# Patient Record
Sex: Female | Born: 1975 | Race: White | Hispanic: No | Marital: Married | Smoking: Former smoker
Health system: Southern US, Community
[De-identification: ages and names within clinical notes are randomized; demographics above are authoritative.]

## PROBLEM LIST (undated history)

## (undated) DIAGNOSIS — L409 Psoriasis, unspecified: Secondary | ICD-10-CM

## (undated) DIAGNOSIS — F419 Anxiety disorder, unspecified: Secondary | ICD-10-CM

## (undated) DIAGNOSIS — D649 Anemia, unspecified: Secondary | ICD-10-CM

## (undated) DIAGNOSIS — R51 Headache: Secondary | ICD-10-CM

## (undated) DIAGNOSIS — G43009 Migraine without aura, not intractable, without status migrainosus: Secondary | ICD-10-CM

## (undated) DIAGNOSIS — K219 Gastro-esophageal reflux disease without esophagitis: Secondary | ICD-10-CM

## (undated) DIAGNOSIS — K912 Postsurgical malabsorption, not elsewhere classified: Secondary | ICD-10-CM

## (undated) DIAGNOSIS — J302 Other seasonal allergic rhinitis: Secondary | ICD-10-CM

## (undated) DIAGNOSIS — K90829 Short bowel syndrome, unspecified: Secondary | ICD-10-CM

## (undated) HISTORY — DX: Headache: R51

## (undated) HISTORY — DX: Migraine without aura, not intractable, without status migrainosus: G43.009

## (undated) HISTORY — PX: PLANTAR FASCIA SURGERY: SHX746

## (undated) HISTORY — DX: Anemia, unspecified: D64.9

## (undated) HISTORY — PX: WISDOM TOOTH EXTRACTION: SHX21

## (undated) HISTORY — DX: Short bowel syndrome, unspecified: K90.829

## (undated) HISTORY — DX: Other seasonal allergic rhinitis: J30.2

## (undated) HISTORY — DX: Psoriasis, unspecified: L40.9

## (undated) HISTORY — DX: Anxiety disorder, unspecified: F41.9

## (undated) HISTORY — DX: Gastro-esophageal reflux disease without esophagitis: K21.9

## (undated) HISTORY — DX: Postsurgical malabsorption, not elsewhere classified: K91.2

---

## 1998-10-06 ENCOUNTER — Other Ambulatory Visit: Admission: RE | Admit: 1998-10-06 | Discharge: 1998-10-06 | Payer: Self-pay | Admitting: Gynecology

## 1999-12-19 ENCOUNTER — Ambulatory Visit (HOSPITAL_COMMUNITY): Admission: EM | Admit: 1999-12-19 | Discharge: 1999-12-19 | Payer: Self-pay

## 2000-01-17 ENCOUNTER — Ambulatory Visit (HOSPITAL_BASED_OUTPATIENT_CLINIC_OR_DEPARTMENT_OTHER): Admission: RE | Admit: 2000-01-17 | Discharge: 2000-01-17 | Payer: Self-pay | Admitting: Orthopedic Surgery

## 2000-10-23 ENCOUNTER — Other Ambulatory Visit: Admission: RE | Admit: 2000-10-23 | Discharge: 2000-10-23 | Payer: Self-pay | Admitting: *Deleted

## 2000-12-31 HISTORY — PX: CARPAL TUNNEL RELEASE: SHX101

## 2001-10-29 ENCOUNTER — Other Ambulatory Visit: Admission: RE | Admit: 2001-10-29 | Discharge: 2001-10-29 | Payer: Self-pay | Admitting: *Deleted

## 2005-03-12 ENCOUNTER — Ambulatory Visit: Payer: Self-pay | Admitting: Internal Medicine

## 2005-07-31 ENCOUNTER — Ambulatory Visit: Payer: Self-pay | Admitting: Internal Medicine

## 2006-01-17 ENCOUNTER — Ambulatory Visit (HOSPITAL_COMMUNITY): Admission: RE | Admit: 2006-01-17 | Discharge: 2006-01-17 | Payer: Self-pay | Admitting: Family Medicine

## 2006-01-17 ENCOUNTER — Encounter: Admission: RE | Admit: 2006-01-17 | Discharge: 2006-01-17 | Payer: Self-pay | Admitting: Family Medicine

## 2006-01-17 ENCOUNTER — Ambulatory Visit: Payer: Self-pay | Admitting: Family Medicine

## 2006-01-18 ENCOUNTER — Ambulatory Visit: Payer: Self-pay | Admitting: Family Medicine

## 2006-01-23 ENCOUNTER — Ambulatory Visit: Payer: Self-pay

## 2006-01-29 ENCOUNTER — Encounter: Payer: Self-pay | Admitting: Cardiology

## 2006-01-29 ENCOUNTER — Ambulatory Visit: Payer: Self-pay

## 2006-02-06 ENCOUNTER — Ambulatory Visit: Payer: Self-pay | Admitting: Critical Care Medicine

## 2006-03-18 ENCOUNTER — Ambulatory Visit: Payer: Self-pay | Admitting: Cardiology

## 2006-04-02 ENCOUNTER — Ambulatory Visit: Payer: Self-pay

## 2006-04-25 ENCOUNTER — Ambulatory Visit: Payer: Self-pay | Admitting: Family Medicine

## 2007-01-31 ENCOUNTER — Emergency Department (HOSPITAL_COMMUNITY): Admission: EM | Admit: 2007-01-31 | Discharge: 2007-01-31 | Payer: Self-pay | Admitting: Emergency Medicine

## 2007-06-19 ENCOUNTER — Encounter (INDEPENDENT_AMBULATORY_CARE_PROVIDER_SITE_OTHER): Payer: Self-pay | Admitting: Obstetrics and Gynecology

## 2007-06-19 ENCOUNTER — Inpatient Hospital Stay (HOSPITAL_COMMUNITY): Admission: AD | Admit: 2007-06-19 | Discharge: 2007-06-22 | Payer: Self-pay | Admitting: Obstetrics and Gynecology

## 2007-11-06 ENCOUNTER — Ambulatory Visit: Payer: Self-pay | Admitting: Family Medicine

## 2007-11-06 DIAGNOSIS — R51 Headache: Secondary | ICD-10-CM

## 2007-11-06 DIAGNOSIS — R519 Headache, unspecified: Secondary | ICD-10-CM | POA: Insufficient documentation

## 2011-05-15 NOTE — Op Note (Signed)
Jody Parrish, Jody Parrish               ACCOUNT NO.:  [id number]   MEDICAL RECORD NO.:  [id number]          PATIENT TYPE:  INP   LOCATION:  9102                          FACILITY:  [facility]   PHYSICIAN:  Maxie Better, M.D.DATE OF BIRTH:  [birth year]   DATE OF PROCEDURE:  06/19/2007  DATE OF DISCHARGE:                               OPERATIVE REPORT   PREOPERATIVE DIAGNOSIS:  Fetal tachycardia, arrest of dilatation.   PROCEDURE:  Primary cesarean section Kerr hysterotomy.   POSTOPERATIVE DIAGNOSIS:  Fetal tachycardia arrest of dilatation.   ANESTHESIA:  Epidural.   SURGEON:  Maxie Better, M.D.   ASSISTANT:  Gerri Spore B. Earlene Plater, M.D.   PROCEDURE:  Under adequate epidural anesthesia the patient is placed in  the supine position with a left lateral tilt.  An indwelling Foley  catheter was already in place.  The patient was sterilely prepped and  draped in usual fashion.  Quarter percent Marcaine was injected along  the planned Pfannenstiel skin incision site.  The Pfannenstiel skin  incision was then made, carried down to the rectus fascia.  Rectus  fascia was opened transversely.  Rectus fascia was bluntly and sharply  dissected off the rectus muscle in superior and inferior fashion.  Rectus muscles split in midline.  The parietal peritoneum was entered  bluntly and extended superiorly and inferiorly.  The vesicouterine  peritoneum was opened transversely.  The bladder was then bluntly  dissected off the lower uterine segment and displaced inferiorly.  A  curvilinear low transverse uterine incision was then made and extended  with bandage scissors.  Subsequent delivery of a live female from the  right occiput transverse position was accomplished.  Cord around the  neck x1 was reducible.  The baby was bulb suctioned on the abdomen. Cord  was clamped, cut the baby was transferred to the awaiting pediatrician  who assigned Apgars of 8 and 10 at 1 an 5 minutes.  The placenta which  was  posterior was spontaneous intact.  Uterine cavity was cleaned of  debris.  Uterine incision had no extension.  Uterine incision was closed  in two layers the first layer 0 Monocryl running locked stitch.  Second  layer was imbricating using 0 Monocryl suture.  Normal tubes and ovaries  were noted bilaterally.  The abdomen was copiously irrigated, suctioned  of debris.  The parietal peritoneum was closed with 2-0 Vicryl suture.  The rectus fascia was closed with 0 Vicryl x2.  The subcutaneous areas  irrigated, small bleeders cauterized 2-0 plain interrupted sutures were  then placed. Skin was approximated using Ethicon staples.  Specimen was  placenta sent to pathology.  Estimated blood loss was 600 mL.  Intraoperative fluid was 1400 mL crystalloid.  Urine output was 150 mL  slightly bloody urine  which was clearing at the time of transfer to the recovery room.  The  blood was in the urine prior to starting the surgery.  Sponge and  instrument counts x2 was correct.  Complication was none.  The baby was  8 pounds 2 ounces.  The patient tolerated the procedure well, was  transferred  to recovery in stable condition.      Maxie Better, M.D.  Electronically Signed     [place]/MEDQ  D:  06/19/2007  T:  06/20/2007  Job:  [id number]

## 2011-05-18 NOTE — Op Note (Signed)
[place]  Patient:    Jody Parrish                       MRN: [id number] Proc. Date: 01/17/00 Adm. Date:  [id number] Attending:  Marlowe [place] CC:         Artist Pais Weingold, M.D. x 2                           Operative Report  PREOPERATIVE DIAGNOSIS:  Right wrist median ulnar nerve compression.  POSTOPERATIVE DIAGNOSIS:  Right wrist median ulnar nerve compression.  OPERATION:  Decompression of median nerve and ulnar nerve through separate fascial incisions at the right wrist.  SURGEON:  Artist Pais. Mina Marble, M.D.  ANESTHESIA:  General.  TOURNIQUET TIME:  31 minutes.  COMPLICATIONS:  None.  DRAINS:  None.  DESCRIPTION OF PROCEDURE:  The patient was taken to the operating room after induction of adequate general anesthesia.  The right upper extremity is prepped and draped in the usual sterile fashion.  An Esmarch was used to exsanguinate the limb and the tourniquet was inflated with 250 mmHg.  At this point and time, an incision was made starting at the distal aspect in the area of the thenar crease extending across to the distal wrist crease and then curving over toward the ulnar side with care to cross the flexion creases of the wrist at 60 degree angles and then carried proximally along the FCU tendon for approximately 4 cm.  The incision was taken down through the skin and subcutaneous tissues with care to carefully identify and retract branches of the median ulnar nerve subcutaneously. At the most proximal part of the wound, the ulnar artery and nerve was identified and decompressed to the level of Guillands canal.  At this point and time, a large flap was raised over Guillands canal and the carpal canal and the ulnar nerve was decompressed to its bifurcation into the deep motor branch and the arch.  Decompression was carried out using tenotomy scissors under loupe magnification.  After the ulnar artery nerve  had been adequately decompressed distally, attention was then paid to the radial part of the wound where the median nerve was identified just under the palmaris longus tendon.  The median nerve was identified and decompressed proximally for a distance of 5 cm under the skin and then distally the proximal edge of the transverse carpal ligament was identified.  Using a freer elevator under the transverse carpal ligament to protect the median nerve, the transverse carpal ligament was then divided in its entirety using Stout scissors.  Inspection of the median nerve showed significant amount of compression with hyperemia and throughout its entire length into the carpal canal.  The median nerve was explored down to the superficial palmar arch and decompressed to that level.  Attention was again redirected toward the ulnar artery nerve where continued decompression was carried out distally until the entire neurovascular bundle had been completely decompressed.  The wounds were then thoroughly irrigated and the skin was then closed with 4-0 nylon in a combination of simple and horizontal mattress sutures.  A sterile dressing of Xeroform, 4 by 4s, fluff, and a compressive dressing as well as a volar splint was applied.  The patient tolerated the procedure well and went to the recovery room in stable fashion. DD:  01/17/00 TD:  01/17/00 Job: [id number]

## 2011-05-18 NOTE — Discharge Summary (Signed)
Jody Parrish, Jody Parrish               ACCOUNT NO.:  [id number]   MEDICAL RECORD NO.:  [id number]          PATIENT TYPE:  INP   LOCATION:  9102                          FACILITY:  [facility]   PHYSICIAN:  Maxie Better, M.D.DATE OF BIRTH:  [birth year]   DATE OF ADMISSION:  06/19/2007  DATE OF DISCHARGE:  06/22/2007                               DISCHARGE SUMMARY   ADMISSION DIAGNOSIS:  Latent phase labor,  term gestation.   DISCHARGE DIAGNOSES:  1. Term gestation, delivered.  2. Status post primary cesarean section.  3. Arrest of dilatation.  4. Fetal tachycardia.   PROCEDURE:  Primary cesarean section.   HISTORY OF PRESENT ILLNESS:  This is a 35 year old G3, P0-0-2-0 female  at 41-1/7th's weeks presented in early labor.   HOSPITAL COURSE:  The patient was admitted to [facility].  Her  cervical exam was 2-to3-cm, 80% vertex, minus 2 with a bulging bag of  membranes.  Her tracing was reassuring.  Her contractions were every 2-4  minutes, moderate by palpation.  The patient desired natural childbirth  and expectant management was done.  The patient had artifical rupture of  membranes with copious clear fluid noted at the time that she was 6-cm,  80%, minus 3.  The patient remained in labor.  She progressed to 9-cm,  however, intrauterine pressure catheter which had been placed showed  that she had a greater than 200 MV units for greater than 2 hours with  no cervical change on examination.  The tracing was notable for fetal  tachycardia to the 180s.  Gentamicin and clindamycin were started.  A  urine culture was sent.  The exam was notable for the maternal temp of  101.  She was 9-cm but occiput transverse, minus station.  The  presumption was that the fetal tachycardia was secondary to maternal  temp which was due to  presumed chorioamnionitis.  Given the fact that  the patient had protracted active phase, followed by no further  progression with adequate labor, the primary  cesarean section was  recommended.  The patient a primary cesarean section.  The procedure  resulted in the delivery of a live female with a cord around the neck, 8  pounds, 2 ounces, Apgar's of 8 and 10.  The placenta was sent to  pathology and showed no evidence of chorioamnionitis.  The patient  subsequently had an uncomplicated postoperative course.  She was  continued on antibiotics until she was afebrile for greater than 24  hours.  On post-op day #3, the patient remained afebrile.  She  subsequently had no evidence of infection from her incision and was  tolerating a regular diet, was deemed well to be discharged home.   DISPOSITION:  Home.   CONDITION:  Stable.   DISCHARGE MEDICATIONS:  1. Ibuprofen 800 mg every 8 hours p.r.n. pain.  2. Percocet 1-2 tablets every 4-6 hours p.r.n. pain.  3. Prenatal vitamins 1 p.o. daily.   DISCHARGE INSTRUCTIONS:  Per the postpartum booklet given.   FOLLOWUP APPOINTMENT:  At [facility] in 6 weeks.      Maxie Better, M.D.  Electronically Signed     [place]/MEDQ  D:  07/30/2007  T:  07/31/2007  Job:  [id number]

## 2011-07-31 ENCOUNTER — Ambulatory Visit (INDEPENDENT_AMBULATORY_CARE_PROVIDER_SITE_OTHER): Payer: BC Managed Care – PPO | Admitting: Family

## 2011-07-31 ENCOUNTER — Encounter: Payer: Self-pay | Admitting: Family

## 2011-07-31 VITALS — BP 102/80 | HR 78 | Temp 98.0°F | Resp 16 | Ht 64.5 in | Wt 199.1 lb

## 2011-07-31 DIAGNOSIS — H6690 Otitis media, unspecified, unspecified ear: Secondary | ICD-10-CM

## 2011-07-31 DIAGNOSIS — H6691 Otitis media, unspecified, right ear: Secondary | ICD-10-CM

## 2011-07-31 MED ORDER — AZITHROMYCIN 250 MG PO TABS: ORAL_TABLET | ORAL | Status: AC

## 2011-07-31 MED ORDER — CEFUROXIME AXETIL 500 MG PO TABS: 500.0000 mg | ORAL_TABLET | Freq: Two times a day (BID) | ORAL | Status: DC

## 2011-07-31 MED ORDER — NEOMYCIN-POLYMYXIN-HC 3.5-10000-1 OT SOLN: 3.0000 [drp] | Freq: Three times a day (TID) | OTIC | Status: AC

## 2011-07-31 NOTE — Assessment & Plan Note (Signed)
rx cancelled for cefuroxime- (Pt reports hives with amoxicillin). Will treat for probable right right OM.  If no improvement in pain in 48 hrs pt to also start cortisporin drops.

## 2011-07-31 NOTE — Progress Notes (Signed)
  Subjective:    Patient ID: Jody Parrish, female    DOB: 08/12/1976, 35 y.o.   MRN: [id number]  HPI  Jody Parrish is a 35 yr old female who presents today with chief complaint of bilateral ear pain R>L.  Symptoms started 1 week ago.  Symptoms have been exacerbated by swimming. Some improvement in her pain with the use of motrin.Denies problems with hearing or ear drainage.  Denies fever or nasal congestion.     Review of Systems See HPI  No past medical history on file.  History   Social History  . Marital Status: Single    Spouse Name: N/A    Number of Children: N/A  . Years of Education: N/A   Occupational History  . Not on file.   Social History Main Topics  . Smoking status: Former Smoker -- 10 years    Types: Cigarettes  . Smokeless tobacco: Never Used  . Alcohol Use: Not on file  . Drug Use: Not on file  . Sexually Active: Not on file   Other Topics Concern  . Not on file   Social History Narrative  . No narrative on file    No past surgical history on file.  No family history on file.  Allergies  Allergen Reactions  . Amoxicillin Hives    No current outpatient prescriptions on file prior to visit.    BP 102/80  Pulse 78  Temp(Src) 98 F (36.7 C) (Oral)  Resp 16  Ht 5' 4.5" (1.638 m)  Wt 199 lb 1.3 oz (90.302 kg)  BMI 33.64 kg/m2       Objective:   Physical Exam  Constitutional: She appears well-developed and well-nourished.  HENT:  Mouth/Throat: Uvula is midline, oropharynx is clear and moist and mucous membranes are normal.       Right canal is slightly pink, R TM is dull without bulging.  L canal- less pink than right canal R TM intact- + COL.    Eyes: Conjunctivae are normal.  Psychiatric: She has a normal mood and affect. Her behavior is normal. Judgment and thought content normal.          Assessment & Plan:

## 2011-07-31 NOTE — Patient Instructions (Signed)
Please follow up as needed. Start ear drops in addition to your oral antibiotics if you ears are not feeling better in 48 hours.

## 2011-10-17 LAB — CBC
HCT: 32.3 — ABNORMAL LOW
Hemoglobin: 11.1 — ABNORMAL LOW
MCHC: 34.1
Platelets: 181
Platelets: 201
RDW: 13.9

## 2011-10-17 LAB — CREATININE, SERUM
Creatinine, Ser: 0.84
GFR calc Af Amer: >60
GFR calc non Af Amer: >60

## 2011-10-17 LAB — URINE CULTURE
Culture: NO GROWTH
Special Requests: NEGATIVE

## 2012-12-20 LAB — HM PAP SMEAR

## 2013-05-22 ENCOUNTER — Encounter: Payer: Self-pay | Admitting: Family Medicine

## 2013-05-22 ENCOUNTER — Ambulatory Visit (INDEPENDENT_AMBULATORY_CARE_PROVIDER_SITE_OTHER): Payer: BC Managed Care – PPO | Admitting: Family Medicine

## 2013-05-22 VITALS — BP 100/68 | HR 96 | Temp 99.3°F | Wt 191.0 lb

## 2013-05-22 DIAGNOSIS — H60399 Other infective otitis externa, unspecified ear: Secondary | ICD-10-CM

## 2013-05-22 DIAGNOSIS — H60393 Other infective otitis externa, bilateral: Secondary | ICD-10-CM

## 2013-05-22 MED ORDER — OFLOXACIN 0.3 % OT SOLN: 10.0000 [drp] | Freq: Every day | OTIC | Status: DC

## 2013-05-22 NOTE — Progress Notes (Signed)
  Subjective:     Jody Parrish is a 37 y.o. female who presents for evaluation of symptoms of a URI, R ear pain. Symptoms include right ear pressure/pain. Onset of symptoms was a few days ago, and has been gradually worsening since that time. Treatment to date: antihistamines and nasal steroids.  The following portions of the patient's history were reviewed and updated as appropriate: allergies, current medications, past family history, past medical history, past social history, past surgical history and problem list.  Review of Systems Pertinent items are noted in HPI.   Objective:    BP 100/68  Pulse 96  Temp(Src) 99.3 F (37.4 C) (Oral)  Wt 191 lb (86.637 kg)  BMI 32.29 kg/m2  SpO2 95% General appearance: alert, cooperative, appears stated age and no distress Ears: both canals errythematous---R>L Nose: clear discharge, mild congestion, turbinates red, swollen, no sinus tenderness Throat: abnormal findings: mild oropharyngeal erythema and PND Neck: no adenopathy, supple, symmetrical, trachea midline and thyroid not enlarged, symmetric, no tenderness/mass/nodules Lungs: clear to auscultation bilaterally Heart: S1, S2 normal   Assessment:    viral upper respiratory illness and otits externa   Plan:    Discussed diagnosis and treatment of URI. Suggested symptomatic OTC remedies. Nasal steroids per orders. Follow up as needed. cont claritin  floxin otic drops Call prn

## 2013-05-22 NOTE — Patient Instructions (Signed)
Otitis Externa Otitis externa is a bacterial or fungal infection of the outer ear canal. This is the area from the eardrum to the outside of the ear. Otitis externa is sometimes called "swimmer's ear." CAUSES  Possible causes of infection include:  Swimming in dirty water.  Moisture remaining in the ear after swimming or bathing.  Mild injury (trauma) to the ear.  Objects stuck in the ear (foreign body).  Cuts or scrapes (abrasions) on the outside of the ear. SYMPTOMS  The first symptom of infection is often itching in the ear canal. Later signs and symptoms may include swelling and redness of the ear canal, ear pain, and yellowish-white fluid (pus) coming from the ear. The ear pain may be worse when pulling on the earlobe. DIAGNOSIS  Your caregiver will perform a physical exam. A sample of fluid may be taken from the ear and examined for bacteria or fungi. TREATMENT  Antibiotic ear drops are often given for 10 to 14 days. Treatment may also include pain medicine or corticosteroids to reduce itching and swelling. PREVENTION   Keep your ear dry. Use the corner of a towel to absorb water out of the ear canal after swimming or bathing.  Avoid scratching or putting objects inside your ear. This can damage the ear canal or remove the protective wax that lines the canal. This makes it easier for bacteria and fungi to grow.  Avoid swimming in lakes, polluted water, or poorly chlorinated pools.  You may use ear drops made of rubbing alcohol and vinegar after swimming. Combine equal parts of white vinegar and alcohol in a bottle. Put 3 or 4 drops into each ear after swimming. HOME CARE INSTRUCTIONS   Apply antibiotic ear drops to the ear canal as prescribed by your caregiver.  Only take over-the-counter or prescription medicines for pain, discomfort, or fever as directed by your caregiver.  If you have diabetes, follow any additional treatment instructions from your caregiver.  Keep all  follow-up appointments as directed by your caregiver. SEEK MEDICAL CARE IF:   You have a fever.  Your ear is still red, swollen, painful, or draining pus after 3 days.  Your redness, swelling, or pain gets worse.  You have a severe headache.  You have redness, swelling, pain, or tenderness in the area behind your ear. MAKE SURE YOU:   Understand these instructions.  Will watch your condition.  Will get help right away if you are not doing well or get worse. Document Released: 12/17/2005 Document Revised: 03/10/2012 Document Reviewed: 01/03/2012 [employer] Patient Information 2014 [employer].  

## 2013-09-03 ENCOUNTER — Ambulatory Visit: Payer: Self-pay | Admitting: Certified Nurse Midwife

## 2013-11-05 ENCOUNTER — Encounter: Payer: Self-pay | Admitting: Family Medicine

## 2013-11-05 ENCOUNTER — Ambulatory Visit (INDEPENDENT_AMBULATORY_CARE_PROVIDER_SITE_OTHER): Payer: BC Managed Care – PPO | Admitting: Family Medicine

## 2013-11-05 ENCOUNTER — Other Ambulatory Visit: Payer: Self-pay

## 2013-11-05 VITALS — BP 120/78 | HR 83 | Temp 98.7°F | Wt 190.6 lb

## 2013-11-05 DIAGNOSIS — H10029 Other mucopurulent conjunctivitis, unspecified eye: Secondary | ICD-10-CM

## 2013-11-05 DIAGNOSIS — J019 Acute sinusitis, unspecified: Secondary | ICD-10-CM

## 2013-11-05 DIAGNOSIS — H10023 Other mucopurulent conjunctivitis, bilateral: Secondary | ICD-10-CM

## 2013-11-05 MED ORDER — CLARITHROMYCIN ER 500 MG PO TB24: 1000.0000 mg | ORAL_TABLET | Freq: Every day | ORAL | Status: AC

## 2013-11-05 MED ORDER — MOXIFLOXACIN HCL 0.5 % OP SOLN: 1.0000 [drp] | Freq: Three times a day (TID) | OPHTHALMIC | Status: DC

## 2013-11-05 NOTE — Progress Notes (Signed)
  Subjective:     Jody Parrish is a 37 y.o. female who presents for evaluation of sinus pain. Symptoms include: congestion, facial pain, headaches, itchy eyes, nasal congestion, purulent rhinorrhea, sinus pressure and pink eye-- everyone in house has it. Onset of symptoms was 4 days ago. Symptoms have been gradually worsening since that time. Past history is significant for no history of pneumonia or bronchitis. Patient is a non-smoker.  The following portions of the patient's history were reviewed and updated as appropriate: allergies, current medications, past family history, past medical history, past social history, past surgical history and problem list.  Review of Systems Pertinent items are noted in HPI.   Objective:    BP 120/78  Pulse 83  Temp(Src) 98.7 F (37.1 C) (Oral)  Wt 190 lb 9.6 oz (86.456 kg)  SpO2 97% General appearance: alert, cooperative, appears stated age and no distress Eyes: + conjunctiva injected, teary Ears: normal TM's and external ear canals both ears Nose: green discharge, moderate congestion, turbinates red, swollen, sinus tenderness bilateral Throat: lips, mucosa, and tongue normal; teeth and gums normal Neck: mild anterior cervical adenopathy, supple, symmetrical, trachea midline and thyroid not enlarged, symmetric, no tenderness/mass/nodules Lungs: clear to auscultation bilaterally Heart: S1, S2 normal    Assessment:    Acute bacterial sinusitis and b/l pink eye.    Plan:    Nasal steroids per medication orders. Antihistamines per medication orders. Biaxin per medication orders. vigamox

## 2013-11-05 NOTE — Patient Instructions (Signed)

## 2014-04-20 ENCOUNTER — Ambulatory Visit (INDEPENDENT_AMBULATORY_CARE_PROVIDER_SITE_OTHER): Payer: BC Managed Care – PPO | Admitting: Family Medicine

## 2014-04-20 ENCOUNTER — Encounter: Payer: Self-pay | Admitting: Family Medicine

## 2014-04-20 VITALS — BP 112/80 | HR 83 | Temp 98.6°F | Wt 192.8 lb

## 2014-04-20 DIAGNOSIS — J329 Chronic sinusitis, unspecified: Secondary | ICD-10-CM

## 2014-04-20 MED ORDER — METHYLPREDNISOLONE ACETATE 80 MG/ML IJ SUSP
80.0000 mg | Freq: Once | INTRAMUSCULAR | Status: DC
  Administered 2014-04-20: 80 mg via INTRAMUSCULAR

## 2014-04-20 MED ORDER — SULFAMETHOXAZOLE-TMP DS 800-160 MG PO TABS: 1.0000 | ORAL_TABLET | Freq: Two times a day (BID) | ORAL | Status: DC

## 2014-04-20 NOTE — Addendum Note (Signed)
Addended by: Rudene Anda on: 04/20/2014 01:58 PM   Modules accepted: Orders

## 2014-04-20 NOTE — Patient Instructions (Signed)

## 2014-04-20 NOTE — Progress Notes (Signed)
Pre visit review using our clinic review tool, if applicable. No additional management support is needed unless otherwise documented below in the visit note. 

## 2014-04-20 NOTE — Progress Notes (Signed)
  Subjective:     Jody Parrish is a 38 y.o. female who presents for evaluation of sinus pain. Symptoms include: congestion, facial pain, headaches and sinus pressure. Onset of symptoms was 3 weeks ago. Symptoms have been gradually worsening since that time. Past history is significant for no history of pneumonia or bronchitis. Patient is a non-smoker.  The following portions of the patient's history were reviewed and updated as appropriate: allergies, current medications, past family history, past medical history, past social history, past surgical history and problem list.  Review of Systems Pertinent items are noted in HPI.   Objective:    BP 112/80  Pulse 83  Temp(Src) 98.6 F (37 C) (Oral)  Wt 192 lb 12.8 oz (87.454 kg)  SpO2 99% General appearance: alert, cooperative, appears stated age and mild distress Ears: normal TM's and external ear canals both ears Nose: green discharge, moderate congestion, turbinates red, swollen, sinus tenderness bilateral Throat: lips, mucosa, and tongue normal; teeth and gums normal Neck: no adenopathy, supple, symmetrical, trachea midline and thyroid not enlarged, symmetric, no tenderness/mass/nodules Lungs: clear to auscultation bilaterally Heart: S1, S2 normal    Assessment:    Acute bacterial sinusitis.    Plan:    Nasal steroids per medication orders. Antihistamines per medication orders. Bactrim per medication orders.

## 2014-04-22 ENCOUNTER — Encounter: Payer: Self-pay | Admitting: Family Medicine

## 2014-04-22 MED ORDER — CLARITHROMYCIN ER 500 MG PO TB24: 1000.0000 mg | ORAL_TABLET | Freq: Every day | ORAL | Status: DC

## 2014-04-27 ENCOUNTER — Encounter: Payer: Self-pay | Admitting: Family Medicine

## 2014-04-28 ENCOUNTER — Other Ambulatory Visit: Payer: Self-pay | Admitting: Family Medicine

## 2014-04-28 DIAGNOSIS — J329 Chronic sinusitis, unspecified: Secondary | ICD-10-CM

## 2014-04-29 ENCOUNTER — Ambulatory Visit
Admission: RE | Admit: 2014-04-29 | Discharge: 2014-04-29 | Disposition: A | Discharge status: Home / Self Care | Payer: BC Managed Care – PPO | Source: Ambulatory Visit | Attending: Family Medicine | Admitting: Family Medicine

## 2014-04-29 DIAGNOSIS — J329 Chronic sinusitis, unspecified: Secondary | ICD-10-CM

## 2014-05-03 ENCOUNTER — Other Ambulatory Visit: Payer: Self-pay | Admitting: Family Medicine

## 2014-05-03 DIAGNOSIS — G8929 Other chronic pain: Secondary | ICD-10-CM

## 2014-05-03 DIAGNOSIS — R51 Headache: Principal | ICD-10-CM

## 2014-05-06 ENCOUNTER — Encounter: Payer: Self-pay | Admitting: Family Medicine

## 2014-05-10 ENCOUNTER — Encounter: Payer: Self-pay | Admitting: Neurology

## 2014-05-10 ENCOUNTER — Ambulatory Visit (INDEPENDENT_AMBULATORY_CARE_PROVIDER_SITE_OTHER): Payer: BC Managed Care – PPO | Admitting: Neurology

## 2014-05-10 VITALS — BP 120/72 | HR 68 | Resp 18 | Ht 64.5 in | Wt 189.0 lb

## 2014-05-10 DIAGNOSIS — R51 Headache: Secondary | ICD-10-CM

## 2014-05-10 MED ORDER — PREDNISONE 10 MG PO TABS: ORAL_TABLET | ORAL | Status: DC

## 2014-05-10 NOTE — Patient Instructions (Addendum)
1. MRI brain without contrast 2. Start Prednisone 63m: Take 6 tabs on day 1, 5 tabs on day 2, 4 tabs on day 3, 3 tabs on day 4, 2 tabs on days 5 and 6, 1 tab on days 7 and 8 3. Continue avoidance of headache triggers 4. Refer to re-establish with allergist Dr. WOrvil Feil June 3 '@1' :45 at Dr WOrvil Feilat [facility] 5. Check ESR

## 2014-05-10 NOTE — Progress Notes (Signed)
 NEUROLOGY CONSULTATION NOTE  Jody Parrish MRN: [id number] DOB: 09/30/1976  Referring provider: Dr. Yvonne Lowne Primary care provider: Dr. Yvonne Lowne  Reason for consult:  Daily headaches  Dear Dr Lowne:  Thank you for your kind referral of Jody Parrish for consultation of the above symptoms. Although her history is well known to you, please allow me to reiterate it for the purpose of our medical record. Records and images were personally reviewed where available.  HISTORY OF PRESENT ILLNESS: This is a pleasant 38 year old right-handed woman presenting with constant daily headaches for the past 2 months.  She has a history of migraines from ages 17 to 21, usually over the right frontal region with associated photophobia.  These had resolved and she started having occasional tension headaches for the past 2 years or so, however over the past 2 months, she has had a headache "all day everyday."  The headaches wrap around her head, more intense in the occipital region, with a pressure sensation that is present when she wakes up in the morning, worsening throughout the day. When headaches are intense, she feels her equilibrium is off.  For a time, she had pin in her sinuses, ears, and jaws, and was treated with antibiotics, which helped with these symptoms.  She however continues to have daily headaches. She takes Advil 1 or 2 days out of the week.  She tried avoiding gluten, then started eating more corn-based products, and noticed that this would actually worsen the headaches.  She reports her sister and mother started having food allergies in their 30s, and she is concerned this is contributing/causing her symptoms.  Yesterday she ate [employer] white cheddar snack and within 30 seconds the headache went from mild to intense where she had to lie down unable to focus. She had to take 4 Advil yesterday.  She has noticed a clear difference in her headaches with avoiding corn-based  products.  There is no associated nausea, vomiting, photo/phonophobia.  No diplopia, dysarthria, dysphagia, bladder dysfunction.  She has neck pain, with tenderness to touch.  She has had constant stomach pains for many years.  There is no history of head trauma, no family history of migraines.  She usually gets 7 hours of sleep.    PAST MEDICAL HISTORY: Past Medical History  Diagnosis Date  . Seasonal allergies     PAST SURGICAL HISTORY: Past Surgical History  Procedure Laterality Date  . Carpal tunnel release Right   . Plantar fascia surgery Right   . Cesarean section  x 2  . Wisdom tooth extraction      MEDICATIONS: Current Outpatient Prescriptions on File Prior to Visit  Medication Sig Dispense Refill  . ibuprofen (ADVIL,MOTRIN) 200 MG tablet Take 200 mg by mouth as needed.        . loratadine (CLARITIN) 10 MG tablet Take 10 mg by mouth daily.       No current facility-administered medications on file prior to visit.    ALLERGIES: Allergies  Allergen Reactions  . Amoxicillin Hives  . Sulfa Antibiotics Nausea Only    FAMILY HISTORY: Family History  Problem Relation Age of Onset  . Melanoma Father   . Hypertension Mother   . Hypertension Father     SOCIAL HISTORY: History   Social History  . Marital Status: Single    Spouse Name: N/A    Number of Children: N/A  . Years of Education: N/A   Occupational History  . Not   on file.   Social History Main Topics  . Smoking status: Former Smoker -- 10 years    Types: Cigarettes    Quit date: 05/10/1994  . Smokeless tobacco: Never Used  . Alcohol Use: Yes     Comment: occasionally  . Drug Use: No  . Sexual Activity: Not on file   Other Topics Concern  . Not on file   Social History Narrative  . No narrative on file    REVIEW OF SYSTEMS: Constitutional: No fevers, chills, or sweats, no generalized fatigue, change in appetite Eyes: No visual changes, double vision, eye pain Ear, nose and throat: No  hearing loss, ear pain, nasal congestion, sore throat Cardiovascular: No chest pain, palpitations Respiratory:  No shortness of breath at rest or with exertion, wheezes GastrointestinaI: No nausea, vomiting, diarrhea, +abdominal pain, no fecal incontinence Genitourinary:  No dysuria, urinary retention or frequency Musculoskeletal:  + neck pain, no back pain Integumentary: No rash, pruritus, skin lesions Neurological: as above Psychiatric: No depression, insomnia, anxiety Endocrine: No palpitations, fatigue, diaphoresis, mood swings, change in appetite, change in weight, increased thirst Hematologic/Lymphatic:  No anemia, purpura, petechiae. Allergic/Immunologic: no itchy/runny eyes, nasal congestion, recent allergic reactions, rashes  PHYSICAL EXAM: Filed Vitals:   05/10/14 1342  BP: 120/72  Pulse: 68  Resp: 18   General: No acute distress Head:  Normocephalic/atraumatic, tenderness to palpation over the right temporal region, bilateral occipital regions Neck: supple, + paraspinal tenderness, full range of motion Back: No paraspinal tenderness Heart: regular rate and rhythm Lungs: Clear to auscultation bilaterally. Vascular: No carotid bruits. Skin/Extremities: No rash, no edema Neurological Exam: Mental status: alert and oriented to person, place, and time, no dysarthria or aphasia, Fund of knowledge is appropriate.  Recent and remote memory are intact.  Attention and concentration are normal.    Able to name objects and repeat phrases. Cranial nerves: CN I: not tested CN II: pupils equal, round and reactive to light, visual fields intact, fundi unremarkable. CN III, IV, VI:  full range of motion, no nystagmus, no ptosis CN V: facial sensation intact CN VII: upper and lower face symmetric CN VIII: hearing intact CN IX, X: gag intact, uvula midline CN XI: sternocleidomastoid and trapezius muscles intact CN XII: tongue midline Bulk & Tone: normal, no fasciculations. Motor: 5/5  throughout with no pronator drift. Sensation: intact to light touch, cold, pin, vibration and joint position sense.  No extinction to double simultaneous stimulation.  Romberg test negative Deep Tendon Reflexes: +2 throughout, no ankle clonus Plantar responses: downgoing bilaterally Cerebellar: no incoordination on finger to nose, heel to shin. No dysdiadochokinesia Gait: narrow-based and steady, able to tandem walk adequately. Tremor: none  IMPRESSION: This is a 38 year old right-handed woman with a history of migraines when younger, presenting with a different type of constant daily headache for the past 2 months.  Considerations include tension-type headaches, occipital neuralgia, cervicogenic headaches.  Less likely temporal arteritis, she does note right temporal tenderness, ESR will be ordered.  An MRI brain without contrast will be ordered to assess for underlying structural abnormality.  She will try a week course of prednisone to hopefully break this headache cycle, and will benefit from daily preventative medication such as nortriptyline/amitriptyline, which we will discuss if imaging is normal.  She will follow-up in 1 month.d coordination of care.  Thank you for allowing me to participate in the care of this patient. Please do not hesitate to call for any questions or concerns.   Karen   Delice Lesch, M.D.

## 2014-05-12 ENCOUNTER — Other Ambulatory Visit: Payer: Self-pay | Admitting: *Deleted

## 2014-05-12 ENCOUNTER — Telehealth: Payer: Self-pay | Admitting: Neurology

## 2014-05-12 DIAGNOSIS — R51 Headache: Secondary | ICD-10-CM

## 2014-05-12 NOTE — Telephone Encounter (Signed)
Returning your call. °

## 2014-05-12 NOTE — Telephone Encounter (Signed)
Patient notified Dr Delice Lesch is requesting additional labs (ESR) . Patient states she will come in on Friday to have these done

## 2014-05-14 LAB — SEDIMENTATION RATE: Sed Rate: 1 mm/hr (ref 0–22)

## 2014-05-26 ENCOUNTER — Ambulatory Visit (HOSPITAL_COMMUNITY)
Admission: RE | Admit: 2014-05-26 | Discharge: 2014-05-26 | Disposition: A | Discharge status: Home / Self Care | Payer: BC Managed Care – PPO | Source: Ambulatory Visit | Attending: Neurology | Admitting: Neurology

## 2014-05-26 DIAGNOSIS — R51 Headache: Secondary | ICD-10-CM

## 2014-06-04 ENCOUNTER — Encounter: Payer: Self-pay | Admitting: Neurology

## 2014-06-04 ENCOUNTER — Ambulatory Visit (INDEPENDENT_AMBULATORY_CARE_PROVIDER_SITE_OTHER): Payer: BC Managed Care – PPO | Admitting: Neurology

## 2014-06-04 VITALS — BP 105/64 | HR 104 | Temp 98.2°F | Ht 65.0 in | Wt 177.8 lb

## 2014-06-04 DIAGNOSIS — R51 Headache: Secondary | ICD-10-CM

## 2014-06-04 NOTE — Patient Instructions (Signed)
1. Start Trokendi XR 25mg : Take 1 cap daily for 1 week, then take Trokendi XR 50mg  1 cap daily. At day 5, pls call our office to let us know which dose you were tolerating and we will send refills. 2. Continue avoidance of triggers 3. Keep headache calendar

## 2014-06-04 NOTE — Progress Notes (Signed)
NEUROLOGY FOLLOW UP OFFICE NOTE  Jody Parrish [id number]  HISTORY OF PRESENT ILLNESS: I had the pleasure of seeing Jody Parrish in follow-up in the neurology clinic on 06/04/2014.  The patient was last seen a month ago for daily headaches.  Records and images were personally reviewed where available.  I personally reviewed her MRI brain without contrast which showed minimal punctate foci of T2 and FLAIR signal within the frontal white matter bilaterally.  ESR normal.  She reports the bitemporal headaches are still present everyday but slightly better.  The course of prednisone did not help, it only made her "crazy and emotional."  She has seen the allergist and has been found to have "all kinds of allergies."  She had expressed concern about corn-based products, and did notice a significant increase in headaches when she ate these. She has avoided this for the past 4 weeks. She does not overuse rescue medications. Her allergist feels headaches may be triggered by her allergies as well, she will be having allergy shots for a year.  She denies any new symptoms, no focal numbness/tingling/weakness, sleep is good.  HPI: This is a pleasant 38 yo RH woman with constant daily headaches since March 2015. She has a history of migraines from ages 67 to 84, usually over the right frontal region with associated photophobia. These had resolved and she started having occasional tension headaches for the past 2 years or so, however since March 2015, she has had a headache "all day everyday." The headaches wrap around her head, more intense in the occipital region, with a pressure sensation that is present when she wakes up in the morning, worsening throughout the day. When headaches are intense, she feels her equilibrium is off. For a time, she had pin in her sinuses, ears, and jaws, and was treated with antibiotics, which helped with these symptoms. She however continues to have daily headaches. She takes  Advil 1 or 2 days out of the week. She tried avoiding gluten, then started eating more corn-based products, and noticed that this would actually worsen the headaches.   PAST MEDICAL HISTORY: Past Medical History  Diagnosis Date  . Seasonal allergies   . MKLKJZPH(150.5)     MEDICATIONS: Current Outpatient Prescriptions on File Prior to Visit  Medication Sig Dispense Refill  . ibuprofen (ADVIL,MOTRIN) 200 MG tablet Take 200 mg by mouth as needed.        . loratadine (CLARITIN) 10 MG tablet Take 10 mg by mouth daily.       No current facility-administered medications on file prior to visit.    ALLERGIES: Allergies  Allergen Reactions  . Amoxicillin Hives  . Sulfa Antibiotics Nausea Only    FAMILY HISTORY: Family History  Problem Relation Age of Onset  . Melanoma Father   . Hypertension Mother   . Hypertension Father     SOCIAL HISTORY: History   Social History  . Marital Status: Married    Spouse Name: N/A    Number of Children: N/A  . Years of Education: N/A   Occupational History  . Not on file.   Social History Main Topics  . Smoking status: Former Smoker -- 10 years    Types: Cigarettes    Quit date: 05/10/1994  . Smokeless tobacco: Never Used  . Alcohol Use: Yes     Comment: occasionally  . Drug Use: No  . Sexual Activity: Not on file   Other Topics Concern  . Not on file  Social History Narrative  . No narrative on file    REVIEW OF SYSTEMS: Constitutional: No fevers, chills, or sweats, no generalized fatigue, change in appetite Eyes: No visual changes, double vision, eye pain Ear, nose and throat: No hearing loss, ear pain, nasal congestion, sore throat Cardiovascular: No chest pain, palpitations Respiratory:  No shortness of breath at rest or with exertion, wheezes GastrointestinaI: No nausea, vomiting, diarrhea, abdominal pain, fecal incontinence Genitourinary:  No dysuria, urinary retention or frequency Musculoskeletal:  + neck pain, back  pain Integumentary: No rash, pruritus, skin lesions Neurological: as above Psychiatric: No depression, insomnia, anxiety Endocrine: No palpitations, fatigue, diaphoresis, mood swings, change in appetite, change in weight, increased thirst Hematologic/Lymphatic:  No anemia, purpura, petechiae. Allergic/Immunologic: as above  PHYSICAL EXAM: Filed Vitals:   06/04/14 0916  BP: 105/64  Pulse: 104  Temp: 98.2 F (36.8 C)   General: No acute distress Head:  Normocephalic/atraumatic Neck: supple, no paraspinal tenderness, full range of motion Heart:  Regular rate and rhythm Lungs:  Clear to auscultation bilaterally Back: No paraspinal tenderness Skin/Extremities: No rash, no edema Neurological Exam: alert and oriented to person, place, and time. No aphasia or dysarthria. Fund of knowledge is appropriate.  Recent and remote memory are intact.  Attention and concentration are normal.    Able to name objects and repeat phrases. Cranial nerves: Pupils equal, round, reactive to light.  Fundoscopic exam unremarkable, no papilledema. Extraocular movements intact with no nystagmus. Visual fields full. Facial sensation intact. No facial asymmetry. Tongue, uvula, palate midline.  Motor: Bulk and tone normal, muscle strength 5/5 throughout with no pronator drift.  Sensation to light touch, temperature and vibration intact.  No extinction to double simultaneous stimulation.  Deep tendon reflexes 2+ throughout, toes downgoing.  Finger to nose testing intact.  Gait narrow-based and steady, able to tandem walk adequately.  Romberg negative.  IMPRESSION:  This is a 38 yo RH woman with a history of migraines when younger, with a different type of constant daily headache since March 2015.  She notices a clear worsening with corn-based products.  Brain MRI unremarkable.  We discussed symptomatic treatment of daily headaches as she continues to work with her allergist.  She will start Trokendi XR 30m daily for 1  week, then increase to 561mdaily.  Side effects were discussed, she knows to call our office for any problems.  She was given samples and will call our office if she is tolerating the medication.  She will keep a calendar of the headaches and will continue with avoidance of triggers.  Follow-up in 3 months.  Thank you for allowing me to participate in her care.  Please do not hesitate to call for any questions or concerns.  The duration of this appointment visit was 25 minutes of face-to-face time with the patient.  Greater than 50% of this time was spent in counseling, explanation of diagnosis, planning of further management, and coordination of care.   KaEllouise NewerM.D.

## 2014-08-16 ENCOUNTER — Encounter: Payer: Self-pay | Admitting: Family Medicine

## 2014-08-16 NOTE — Telephone Encounter (Signed)
ok 

## 2014-10-15 ENCOUNTER — Other Ambulatory Visit: Payer: Self-pay

## 2014-10-15 ENCOUNTER — Encounter: Payer: Self-pay | Admitting: Certified Nurse Midwife

## 2014-10-18 ENCOUNTER — Encounter: Payer: Self-pay | Admitting: Gynecology

## 2014-10-18 ENCOUNTER — Ambulatory Visit (INDEPENDENT_AMBULATORY_CARE_PROVIDER_SITE_OTHER): Payer: BC Managed Care – PPO | Admitting: Gynecology

## 2014-10-18 VITALS — BP 110/64 | HR 82 | Resp 18 | Ht 64.5 in | Wt 169.0 lb

## 2014-10-18 DIAGNOSIS — Z0000 Encounter for general adult medical examination without abnormal findings: Secondary | ICD-10-CM

## 2014-10-18 DIAGNOSIS — Z01419 Encounter for gynecological examination (general) (routine) without abnormal findings: Secondary | ICD-10-CM

## 2014-10-18 LAB — POCT URINALYSIS DIPSTICK
Leukocytes, UA: NEGATIVE
PH UA: 5
UROBILINOGEN UA: NEGATIVE

## 2014-10-18 LAB — HEMOGLOBIN, FINGERSTICK: Hemoglobin, fingerstick: 13.6 g/dL (ref 12.0–16.0)

## 2014-10-18 NOTE — Progress Notes (Signed)
38 y.o. Married Caucasian female   [phone] here for annual exam. Pt is currently sexually active. Pt is happy with condoms.   Cycles regular, 3-4d flows   Patient's last menstrual period was 10/10/2014.          Sexually active: Yes.    The current method of family planning is condoms most of the time.    Exercising: Yes.    walking, dance 3-4x/wk Last pap: 09/02/12 NEG HR HPV  Alcohol: 3 drinks/wk Tobacco: no BSE: no  Hgb: 13.6 ; Urine:  Negative    Health Maintenance  Topic Date Due  . Influenza Vaccine  07/31/2014  . Pap Smear  12/21/2015  . Tetanus/tdap  04/21/2019    Family History  Problem Relation Age of Onset  . Melanoma Father   . Hypertension Mother   . Hypertension Father   . Hypertension Paternal Grandmother   . Hypertension Maternal Grandfather   . Osteoporosis Mother     Patient Active Problem List   Diagnosis Date Noted  . Otitis media of right ear 07/31/2011  . HEADACHE 11/06/2007    Past Medical History  Diagnosis Date  . Seasonal allergies   . Headache(784.0)   . Migraine without aura   . GERD (gastroesophageal reflux disease)     Past Surgical History  Procedure Laterality Date  . Carpal tunnel release Right 2002  . Plantar fascia surgery Right   . Cesarean section  x 2  . Wisdom tooth extraction    . Foot surgery  8/13     Plantar Fascitis    Allergies: Amoxicillin and Sulfa antibiotics  Current Outpatient Prescriptions  Medication Sig Dispense Refill  . EPIPEN 2-PAK 0.3 MG/0.3ML SOAJ injection       . levocetirizine (XYZAL) 5 MG tablet       . ibuprofen (ADVIL,MOTRIN) 200 MG tablet Take 200 mg by mouth as needed.        . loratadine (CLARITIN) 10 MG tablet Take 10 mg by mouth daily.       No current facility-administered medications for this visit.    ROS: Pertinent items are noted in HPI.  Exam:    BP 110/64  Pulse 82  Resp 18  Ht 5' 4.5" (1.638 m)  Wt 169 lb (76.658 kg)  BMI 28.57 kg/m2  LMP 10/10/2014 Weight change:  @WEIGHTCHANGE @ Last 3 height recordings:  Ht Readings from Last 3 Encounters:  10/18/14 5' 4.5" (1.638 m)  06/04/14 5\' 5"  (1.651 m)  05/10/14 5' 4.5" (1.638 m)   General appearance: alert, cooperative and appears stated age Head: Normocephalic, without obvious abnormality, atraumatic Neck: no adenopathy, no carotid bruit, no JVD, supple, symmetrical, trachea midline and thyroid not enlarged, symmetric, no tenderness/mass/nodules Lungs: clear to auscultation bilaterally Breasts: normal appearance, no masses or tenderness Heart: regular rate and rhythm, S1, S2 normal, no murmur, click, rub or gallop Abdomen: soft, non-tender; bowel sounds normal; no masses,  no organomegaly Extremities: extremities normal, atraumatic, no cyanosis or edema Skin: Skin color, texture, turgor normal. No rashes or lesions Lymph nodes: Cervical, supraclavicular, and axillary nodes normal. no inguinal nodes palpated Neurologic: Grossly normal   Pelvic: External genitalia:  no lesions              Urethra: normal appearing urethra with no masses, tenderness or lesions              Bartholins and Skenes: Bartholin's, Urethra, Skene's normal  Vagina: normal appearing vagina with normal color and discharge, no lesions              Cervix: normal appearance              Pap taken: No.        Bimanual Exam:  Uterus:  uterus is normal size, shape, consistency and nontender                                      Adnexa:    normal adnexa in size, nontender and no masses                                      Rectovaginal: Confirms                                      Anus:  normal sphincter tone, no lesions      P:  1. Laboratory exam ordered as part of routine general medical examination  - Hemoglobin, fingerstick - POCT Urinalysis Dipstick  2. Encounter for routine gynecological examination counseled on breast self exam, mammography screening, adequate intake of calcium and vitamin D, diet and  exercise Pap smear guideline changes reviewed return annually or prn   An After Visit Summary was printed and given to the patient.

## 2014-11-01 ENCOUNTER — Encounter: Payer: Self-pay | Admitting: Gynecology

## 2015-07-11 ENCOUNTER — Encounter: Payer: Self-pay | Admitting: Family Medicine

## 2016-01-11 ENCOUNTER — Ambulatory Visit (INDEPENDENT_AMBULATORY_CARE_PROVIDER_SITE_OTHER): Payer: BLUE CROSS/BLUE SHIELD | Admitting: Physician Assistant

## 2016-01-11 ENCOUNTER — Encounter: Payer: Self-pay | Admitting: Physician Assistant

## 2016-01-11 ENCOUNTER — Encounter: Payer: Self-pay | Admitting: Family Medicine

## 2016-01-11 ENCOUNTER — Telehealth: Payer: Self-pay | Admitting: Family Medicine

## 2016-01-11 VITALS — BP 106/60 | HR 98 | Temp 98.3°F | Ht 64.5 in | Wt 165.0 lb

## 2016-01-11 DIAGNOSIS — R0789 Other chest pain: Secondary | ICD-10-CM | POA: Diagnosis not present

## 2016-01-11 MED ORDER — ALPRAZOLAM 0.25 MG PO TABS: 0.2500 mg | ORAL_TABLET | Freq: Every evening | ORAL | Status: DC | PRN

## 2016-01-11 MED ORDER — RANITIDINE HCL 150 MG PO TABS: 150.0000 mg | ORAL_TABLET | Freq: Two times a day (BID) | ORAL | Status: DC

## 2016-01-11 NOTE — Patient Instructions (Signed)
Your vital signs, lung exam and EKG all look and sound great.  I feel your symptoms are most likely a combination of anxiety from stress and silent reflux.  Please start the Zantac twice daily. Use the Xanax each night and during the day if symptoms are significant. Try to relax and remove yourself from stressful situations to help with symptoms.  Follow-up in 1 week with Dr. Etter Sjogren. If anything worsens return immediately or go to the ER.

## 2016-01-11 NOTE — Telephone Encounter (Signed)
PLEASE NOTE: All timestamps contained within this report are represented as [place] Standard Time. CONFIDENTIALTY NOTICE: This fax transmission is intended only for the addressee. It contains information that is legally privileged, confidential or otherwise protected from use or disclosure. If you are not the intended recipient, you are strictly prohibited from reviewing, disclosing, copying using or disseminating any of this information or taking any action in reliance on or regarding this information. If you have received this fax in error, please notify us immediately by telephone so that we can arrange for its return to [place]. Phone: [phone], Toll-Free: [phone], Fax: [phone] Page: 1 of 2 Call Id: [id number] [facility] - Client [facility] Patient Name: Jody Parrish Gender: Female DOB: Oct 06, 1976 Age: 40 Y 11 M 11 D Return Phone Number: [phone] (Primary), [phone] (Secondary) Address: City/State/Zip: [place] [facility] - Client Client Site [facility] - Day Physician Garnet Koyanagi Contact Type Call Call Type Triage / Clinical Relationship To Patient Self Appointment Disposition EMR Patient Reports Appointment Already Scheduled Info pasted into Epic Yes Return Phone Number [phone] (Primary) Chief Complaint BREATHING - fast, heavy or wheezing Initial Comment Caller states she has been having trouble breathing, feels like someone is pushing on her chest. PreDisposition Call Doctor Nurse Assessment Nurse: Mechele Dawley, RN, Amy Date/Time Eilene Ghazi Time): 01/11/2016 10:14:40 AM Confirm and document reason for call. If symptomatic, describe symptoms. ---TROUBLE BREATHING. SHE STATES THAT THIS HAS BEEN GOING ON FOR 3 DAYS NOW. SHE STATES THAT FEELS LIKE SOMEONE IS PUSHING ON HER CHEST. SHE STATES THIS HAS BEEN CONSISTENT. SHE FEELS A TIGHTNESS  THAT IS ALWAYS THERE. SHE CAN TAKE A DEEP BREATH IN AND BLOW IT OUT. MID-STERNAL AND ON THE RIGHT SIDE IS WHERE SHE IS HURTING. SHE STATES THERE IS A LITTLE SORENESS SPOT ON HER RIGHT SIDE. NO FEVER OR COUGH RECENTLY. NO TRIPS RECENTLY. NO SURGICAL PROCEDURE. Has the patient traveled out of the country within the last 30 days? ---Not Applicable Does the patient have any new or worsening symptoms? ---Yes Will a triage be completed? ---Yes Related visit to physician within the last 2 weeks? ---No Does the PT have any chronic conditions? (i.e. diabetes, asthma, etc.) ---Yes List chronic conditions. ---ANXIETY, ASTHMA Did the patient indicate they were pregnant? ---No Is this a behavioral health or substance abuse call? ---No Guidelines Guideline Title Affirmed Question Affirmed Notes Nurse Date/Time Eilene Ghazi Time) Breathing Difficulty [1] MILD difficulty breathing (e.g., minimal/ [place], RN, Amy 01/11/2016 10:15:43 AM PLEASE NOTE: All timestamps contained within this report are represented as [place] Standard Time. CONFIDENTIALTY NOTICE: This fax transmission is intended only for the addressee. It contains information that is legally privileged, confidential or otherwise protected from use or disclosure. If you are not the intended recipient, you are strictly prohibited from reviewing, disclosing, copying using or disseminating any of this information or taking any action in reliance on or regarding this information. If you have received this fax in error, please notify us immediately by telephone so that we can arrange for its return to [place]. Phone: [phone], Toll-Free: [phone], Fax: [phone] Page: 2 of 2 Call Id: [id number] Guidelines Guideline Title Affirmed Question Affirmed Notes Nurse Date/Time Eilene Ghazi Time) no SOB at rest, SOB with walking, pulse <100) AND [2] NEW-onset or WORSE than normal Disp. Time Eilene Ghazi Time) Disposition Final User 01/11/2016 10:10:23 AM Send  to Urgent [facility] 01/11/2016 10:21:21 AM See Physician  within 4 Hours (or PCP triage) Yes [place], RN, Amy Caller Understands: Yes Disagree/Comply: Comply Care Advice Given Per Guideline SEE PHYSICIAN WITHIN 4 HOURS (or PCP triage): CALL BACK IF: * You become worse. CARE ADVICE given per Breathing Difficulty (Adult) guideline. After Care Instructions Given Call Event Type User Date / Time Description Referrals REFERRED TO PCP OFFICE

## 2016-01-11 NOTE — Assessment & Plan Note (Signed)
Vitals are stable. Wells Score is 0. Examination is without abnormal findings. EKG obtained revealing NSR. Suspect symptoms are a combination of anxiety and silent reflux. Will begin Zantac 150 mg BID. Will also add on low-dose Xanax PRN for acute anxiety. Calming exercises reviewed. Patient to follow-up 1 week. ER if anything worsens. Patient voices understanding.

## 2016-01-11 NOTE — Progress Notes (Signed)
Pre visit review using our clinic review tool, if applicable. No additional management support is needed unless otherwise documented below in the visit note. 

## 2016-01-11 NOTE — Telephone Encounter (Signed)
We do not have any inhalers on her medication list, with her symptoms she will need to be seen. Would you call and offer her an appointment with one of the providers today.    KP

## 2016-01-11 NOTE — Telephone Encounter (Signed)
Patient seen today By Raiford Noble.,PA

## 2016-01-11 NOTE — Progress Notes (Signed)
Patient presents to clinic today c/o 3-4 days of chest tightness worse at night when lying down. Denies chest pain but notes pressure. Denies SOB or wheezing.  Is a former smoker (> 20 years ago).  Has history of asthma in 75s but has not needed an inhaler in 10 years. Denies cough, chills, fever or other URI symptoms. Denies recent surgery, prolonged immobilization, leg swelling or history of DVT or PE. Denies heartburn or indigestion at present. Endorses some increased stressors between work and her husband's health. Notes some anxiety that is newer to her. Denies depressed mood or panic attack.  Past Medical History  Diagnosis Date  . Seasonal allergies   . Headache(784.0)   . Migraine without aura   . GERD (gastroesophageal reflux disease)   . Short bowel syndrome     Current Outpatient Prescriptions on File Prior to Visit  Medication Sig Dispense Refill  . EPIPEN 2-PAK 0.3 MG/0.3ML SOAJ injection     . levocetirizine (XYZAL) 5 MG tablet      No current facility-administered medications on file prior to visit.    Allergies  Allergen Reactions  . Amoxicillin Hives  . Sulfa Antibiotics Nausea Only  . Penicillins Rash    Family History  Problem Relation Age of Onset  . Melanoma Father   . Hypertension Mother   . Hypertension Father   . Hypertension Paternal Grandmother   . Hypertension Maternal Grandfather   . Osteoporosis Mother     Social History   Social History  . Marital Status: Married    Spouse Name: N/A  . Number of Children: N/A  . Years of Education: N/A   Social History Main Topics  . Smoking status: Former Smoker -- 10 years    Types: Cigarettes    Quit date: 05/10/1994  . Smokeless tobacco: Never Used  . Alcohol Use: 1.5 oz/week    3 drink(s) per week     Comment: occasionally  . Drug Use: No  . Sexual Activity:    Partners: Male    Birth Control/ Protection: Condom   Other Topics Concern  . None   Social History Narrative   Review of  Systems - See HPI.  All other ROS are negative.  BP 106/60 mmHg  Pulse 98  Temp(Src) 98.3 F (36.8 C) (Oral)  Ht 5' 4.5" (1.638 m)  Wt 165 lb (74.844 kg)  BMI 27.90 kg/m2  SpO2 99%  LMP 12/18/2015  Physical Exam  Constitutional: She is oriented to person, place, and time and well-developed, well-nourished, and in no distress.  HENT:  Head: Normocephalic and atraumatic.  Eyes: Conjunctivae are normal.  Neck: Neck supple.  Cardiovascular: Normal rate, regular rhythm, normal heart sounds and intact distal pulses.   Pulmonary/Chest: Effort normal and breath sounds normal. No respiratory distress. She has no wheezes. She has no rales. She exhibits no tenderness.  Neurological: She is alert and oriented to person, place, and time.  Skin: Skin is warm and dry.  Psychiatric: Her mood appears anxious.  Vitals reviewed.  No results found for this or any previous visit (from the past 2160 hour(s)).  Assessment/Plan: Chest tightness Vitals are stable. Wells Score is 0. Examination is without abnormal findings. EKG obtained revealing NSR. Suspect symptoms are a combination of anxiety and silent reflux. Will begin Zantac 150 mg BID. Will also add on low-dose Xanax PRN for acute anxiety. Calming exercises reviewed. Patient to follow-up 1 week. ER if anything worsens. Patient voices understanding.

## 2016-01-11 NOTE — Telephone Encounter (Signed)
Patient contacted and transferred to [facility] and scheduled with [facility] for 11am today

## 2016-01-13 ENCOUNTER — Encounter: Payer: Self-pay | Admitting: Physician Assistant

## 2016-01-13 MED ORDER — ALBUTEROL SULFATE HFA 108 (90 BASE) MCG/ACT IN AERS: 2.0000 | INHALATION_SPRAY | Freq: Four times a day (QID) | RESPIRATORY_TRACT | Status: DC | PRN

## 2016-01-13 NOTE — Telephone Encounter (Signed)
Cody, please advise.

## 2016-01-13 NOTE — Telephone Encounter (Signed)
Handled.  Thank you. 

## 2016-04-16 ENCOUNTER — Encounter: Payer: Self-pay | Admitting: Family Medicine

## 2016-04-16 NOTE — Telephone Encounter (Signed)
We can call it in for 6 months--- but let her know it is know otc

## 2016-04-17 MED ORDER — LEVOCETIRIZINE DIHYDROCHLORIDE 5 MG PO TABS: 5.0000 mg | ORAL_TABLET | Freq: Every evening | ORAL | Status: DC

## 2017-05-14 ENCOUNTER — Other Ambulatory Visit: Payer: Self-pay | Admitting: Family Medicine

## 2017-07-30 ENCOUNTER — Emergency Department (HOSPITAL_COMMUNITY)
Admission: EM | Admit: 2017-07-30 | Discharge: 2017-07-30 | Disposition: A | Discharge status: Home / Self Care | Payer: BLUE CROSS/BLUE SHIELD | Attending: Emergency Medicine | Admitting: Emergency Medicine

## 2017-07-30 ENCOUNTER — Emergency Department (HOSPITAL_COMMUNITY): Payer: BLUE CROSS/BLUE SHIELD

## 2017-07-30 ENCOUNTER — Encounter (HOSPITAL_COMMUNITY): Payer: Self-pay | Admitting: Emergency Medicine

## 2017-07-30 DIAGNOSIS — E559 Vitamin D deficiency, unspecified: Secondary | ICD-10-CM | POA: Diagnosis not present

## 2017-07-30 DIAGNOSIS — Z87891 Personal history of nicotine dependence: Secondary | ICD-10-CM | POA: Insufficient documentation

## 2017-07-30 DIAGNOSIS — S99921A Unspecified injury of right foot, initial encounter: Secondary | ICD-10-CM | POA: Diagnosis not present

## 2017-07-30 DIAGNOSIS — K909 Intestinal malabsorption, unspecified: Secondary | ICD-10-CM | POA: Diagnosis not present

## 2017-07-30 DIAGNOSIS — E039 Hypothyroidism, unspecified: Secondary | ICD-10-CM | POA: Diagnosis not present

## 2017-07-30 DIAGNOSIS — M25579 Pain in unspecified ankle and joints of unspecified foot: Secondary | ICD-10-CM | POA: Diagnosis not present

## 2017-07-30 DIAGNOSIS — M79671 Pain in right foot: Secondary | ICD-10-CM | POA: Insufficient documentation

## 2017-07-30 DIAGNOSIS — J305 Allergic rhinitis due to food: Secondary | ICD-10-CM | POA: Diagnosis not present

## 2017-07-30 DIAGNOSIS — R5383 Other fatigue: Secondary | ICD-10-CM | POA: Diagnosis not present

## 2017-07-30 DIAGNOSIS — Z79899 Other long term (current) drug therapy: Secondary | ICD-10-CM | POA: Diagnosis not present

## 2017-07-30 DIAGNOSIS — K5229 Other allergic and dietetic gastroenteritis and colitis: Secondary | ICD-10-CM | POA: Diagnosis not present

## 2017-07-30 DIAGNOSIS — T148XXA Other injury of unspecified body region, initial encounter: Secondary | ICD-10-CM | POA: Diagnosis not present

## 2017-07-30 DIAGNOSIS — N951 Menopausal and female climacteric states: Secondary | ICD-10-CM | POA: Diagnosis not present
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
## 2017-10-14 DIAGNOSIS — R5383 Other fatigue: Secondary | ICD-10-CM | POA: Diagnosis not present

## 2017-10-14 DIAGNOSIS — K909 Intestinal malabsorption, unspecified: Secondary | ICD-10-CM | POA: Diagnosis not present

## 2017-10-14 DIAGNOSIS — E509 Vitamin A deficiency, unspecified: Secondary | ICD-10-CM | POA: Diagnosis not present

## 2017-10-14 DIAGNOSIS — E039 Hypothyroidism, unspecified: Secondary | ICD-10-CM | POA: Diagnosis not present

## 2017-10-14 DIAGNOSIS — K5229 Other allergic and dietetic gastroenteritis and colitis: Secondary | ICD-10-CM | POA: Diagnosis not present

## 2017-10-14 DIAGNOSIS — E559 Vitamin D deficiency, unspecified: Secondary | ICD-10-CM | POA: Diagnosis not present

## 2018-06-03 ENCOUNTER — Other Ambulatory Visit: Payer: Self-pay | Admitting: Obstetrics and Gynecology

## 2018-06-03 ENCOUNTER — Encounter: Payer: Self-pay | Admitting: Obstetrics and Gynecology

## 2018-06-03 ENCOUNTER — Other Ambulatory Visit: Payer: Self-pay

## 2018-06-03 ENCOUNTER — Ambulatory Visit (INDEPENDENT_AMBULATORY_CARE_PROVIDER_SITE_OTHER): Payer: BLUE CROSS/BLUE SHIELD | Admitting: Obstetrics and Gynecology

## 2018-06-03 ENCOUNTER — Other Ambulatory Visit (HOSPITAL_COMMUNITY)
Admission: RE | Admit: 2018-06-03 | Discharge: 2018-06-03 | Disposition: A | Discharge status: Home / Self Care | Payer: BLUE CROSS/BLUE SHIELD | Source: Ambulatory Visit | Attending: Obstetrics and Gynecology | Admitting: Obstetrics and Gynecology

## 2018-06-03 VITALS — BP 100/70 | HR 88 | Resp 16 | Ht 64.25 in | Wt 147.0 lb

## 2018-06-03 DIAGNOSIS — Z01419 Encounter for gynecological examination (general) (routine) without abnormal findings: Secondary | ICD-10-CM | POA: Insufficient documentation

## 2018-06-03 DIAGNOSIS — Z1231 Encounter for screening mammogram for malignant neoplasm of breast: Secondary | ICD-10-CM

## 2018-06-03 DIAGNOSIS — N852 Hypertrophy of uterus: Secondary | ICD-10-CM | POA: Diagnosis not present

## 2018-06-03 DIAGNOSIS — N926 Irregular menstruation, unspecified: Secondary | ICD-10-CM | POA: Diagnosis not present

## 2018-06-03 LAB — POCT URINE PREGNANCY: Preg Test, Ur: NEGATIVE

## 2018-06-03 NOTE — Patient Instructions (Addendum)
EXERCISE AND DIET:  We recommended that you start or continue a regular exercise program for good health. Regular exercise means any activity that makes your heart beat faster and makes you sweat.  We recommend exercising at least 30 minutes per day at least 3 days a week, preferably 4 or 5.  We also recommend a diet low in fat and sugar.  Inactivity, poor dietary choices and obesity can cause diabetes, heart attack, stroke, and kidney damage, among others.    ALCOHOL AND SMOKING:  Women should limit their alcohol intake to no more than 7 drinks/beers/glasses of wine (combined, not each!) per week. Moderation of alcohol intake to this level decreases your risk of breast cancer and liver damage. And of course, no recreational drugs are part of a healthy lifestyle.  And absolutely no smoking or even second hand smoke. Most people know smoking can cause heart and lung diseases, but did you know it also contributes to weakening of your bones? Aging of your skin?  Yellowing of your teeth and nails?  CALCIUM AND VITAMIN D:  Adequate intake of calcium and Vitamin D are recommended.  The recommendations for exact amounts of these supplements seem to change often, but generally speaking 600 mg of calcium (either carbonate or citrate) and 800 units of Vitamin D per day seems prudent. Certain women may benefit from higher intake of Vitamin D.  If you are among these women, your doctor will have told you during your visit.    PAP SMEARS:  Pap smears, to check for cervical cancer or precancers,  have traditionally been done yearly, although recent scientific advances have shown that most women can have pap smears less often.  However, every woman still should have a physical exam from her gynecologist every year. It will include a breast check, inspection of the vulva and vagina to check for abnormal growths or skin changes, a visual exam of the cervix, and then an exam to evaluate the size and shape of the uterus and  ovaries.  And after 43 years of age, a rectal exam is indicated to check for rectal cancers. We will also provide age appropriate advice regarding health maintenance, like when you should have certain vaccines, screening for sexually transmitted diseases, bone density testing, colonoscopy, mammograms, etc.   MAMMOGRAMS:  All women over 40 years old should have a yearly mammogram. Many facilities now offer a "3D" mammogram, which may cost around $50 extra out of pocket. If possible,  we recommend you accept the option to have the 3D mammogram performed.  It both reduces the number of women who will be called back for extra views which then turn out to be normal, and it is better than the routine mammogram at detecting truly abnormal areas.    COLONOSCOPY:  Colonoscopy to screen for colon cancer is recommended for all women at age 50.  We know, you hate the idea of the prep.  We agree, BUT, having colon cancer and not knowing it is worse!!  Colon cancer so often starts as a polyp that can be seen and removed at colonscopy, which can quite literally save your life!  And if your first colonoscopy is normal and you have no family history of colon cancer, most women don't have to have it again for 10 years.  Once every ten years, you can do something that may end up saving your life, right?  We will be happy to help you get it scheduled when you are ready.    Be sure to check your insurance coverage so you understand how much it will cost.  It may be covered as a preventative service at no cost, but you should check your particular policy.      Uterine Fibroids Uterine fibroids are tissue masses (tumors) that can develop in the womb (uterus). They are also called leiomyomas. This type of tumor is not cancerous (benign) and does not spread to other parts of the body outside of the pelvic area, which is between the hip bones. Occasionally, fibroids may develop in the fallopian tubes, in the cervix, or on the support  structures (ligaments) that surround the uterus. You can have one or many fibroids. Fibroids can vary in size, weight, and where they grow in the uterus. Some can become quite large. Most fibroids do not require medical treatment. What are the causes? A fibroid can develop when a single uterine cell keeps growing (replicating). Most cells in the human body have a control mechanism that keeps them from replicating without control. What are the signs or symptoms? Symptoms may include:  Heavy bleeding during your period.  Bleeding or spotting between periods.  Pelvic pain and pressure.  Bladder problems, such as needing to urinate more often (urinary frequency) or urgently.  Inability to reproduce offspring (infertility).  Miscarriages.  How is this diagnosed? Uterine fibroids are diagnosed through a physical exam. Your health care provider may feel the lumpy tumors during a pelvic exam. Ultrasonography and an MRI may be done to determine the size, location, and number of fibroids. How is this treated? Treatment may include:  Watchful waiting. This involves getting the fibroid checked by your health care provider to see if it grows or shrinks. Follow your health care provider's recommendations for how often to have this checked.  Hormone medicines. These can be taken by mouth or given through an intrauterine device (IUD).  Surgery. ? Removing the fibroids (myomectomy) or the uterus (hysterectomy). ? Removing blood supply to the fibroids (uterine artery embolization).  If fibroids interfere with your fertility and you want to become pregnant, your health care provider may recommend having the fibroids removed. Follow these instructions at home:  Keep all follow-up visits as directed by your health care provider. This is important.  Take over-the-counter and prescription medicines only as told by your health care provider. ? If you were prescribed a hormone treatment, take the  hormone medicines exactly as directed.  Ask your health care provider about taking iron pills and increasing the amount of dark green, leafy vegetables in your diet. These actions can help to boost your blood iron levels, which may be affected by heavy menstrual bleeding.  Pay close attention to your period and tell your health care provider about any changes, such as: ? Increased blood flow that requires you to use more pads or tampons than usual per month. ? A change in the number of days that your period lasts per month. ? A change in symptoms that are associated with your period, such as abdominal cramping or back pain. Contact a health care provider if:  You have pelvic pain, back pain, or abdominal cramps that cannot be controlled with medicines.  You have an increase in bleeding between and during periods.  You soak tampons or pads in a half hour or less.  You feel lightheaded, extra tired, or weak. Get help right away if:  You faint.  You have a sudden increase in pelvic pain. This information is not intended to replace   advice given to you by your health care provider. Make sure you discuss any questions you have with your health care provider. Document Released: 12/14/2000 Document Revised: 08/16/2016 Document Reviewed: 06/15/2014 [employer] Interactive Patient Education  2018 [employer]  

## 2018-06-03 NOTE — Progress Notes (Addendum)
42 y.o. Jody Parrish Married Caucasian female here as an old/new patient for an annual exam. Patient complains of having irregular menses. UPT negative.   Irregular menses for one year.  No menses for 2 months.  Menses are 3 - 8 weeks apart.  One one occasion in February, her cycles were 2 weeks apart.  February had significant hot flashes.  FH of early menopause in mother and maternal GM.  Normal thyroid function in October 2019.  Sees a functional medication doctor for hormonal testing - Darron Doom, MD - [place], [place].  No nipple discharge, increased hair growth, no loss of peripheral vision.  Has HA which are food related.   Really concerned about reaction to food based products in medications.   2 young boys.  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 PCP:  Dr. Garnet Koyanagi  Patient's last menstrual period was 03/15/2018.     Period Cycle (Days): (irregular) Period Duration (Days): 5 Period Pattern: (!) Irregular Menstrual Flow: Heavy, Moderate Menstrual Control: Tampon, Thin pad, Maxi pad Menstrual Control Change Freq (Hours): 2-3 Dysmenorrhea: (!) Moderate Dysmenorrhea Symptoms: Cramping, Diarrhea, Headache     Sexually active: Yes.    The current method of family planning is condoms all of the time.    Exercising: Yes.    walking Smoker:  Former  Health Maintenance: Pap:  About 2015 per patient - normal History of abnormal Pap:  no MMG:  never Colonoscopy:  About 2001 - polyp removed per patient. TDaP:  04/20/09  Gardasil:   no HIV: negative in pregnancy Screening Labs: PCP   reports that she quit smoking about 24 years ago. Her smoking use included cigarettes. She quit after 10.00 years of use. She has never used smokeless tobacco. She reports that she drinks about 1.5 oz of alcohol per week. She reports that she does not use drugs.  Past Medical History:  Diagnosis Date  . Anemia   . Anxiety   . GERD (gastroesophageal reflux disease)   . Headache(784.0)   . Migraine  without aura   . Seasonal allergies   . Short bowel syndrome     Past Surgical History:  Procedure Laterality Date  . CARPAL TUNNEL RELEASE Right 2002  . CESAREAN SECTION  x 2  . PLANTAR FASCIA SURGERY Right   . WISDOM TOOTH EXTRACTION      Current Outpatient Medications  Medication Sig Dispense Refill  . levocetirizine (XYZAL) 5 MG tablet TAKE 1 TABLET (5 MG TOTAL) BY MOUTH EVERY EVENING. 30 tablet 3  . EPIPEN 2-PAK 0.3 MG/0.3ML SOAJ injection      No current facility-administered medications for this visit.     Family History  Problem Relation Age of Onset  . Hypertension Mother   . Osteoporosis Mother   . Atrial fibrillation Mother   . Melanoma Father   . Hypertension Paternal Grandmother   . Hypertension Maternal Grandfather   . Alzheimer's disease Maternal Grandmother     Review of Systems  Constitutional: Negative.   HENT: Negative.   Eyes: Negative.   Respiratory: Negative.   Cardiovascular: Negative.   Gastrointestinal: Negative.   Endocrine: Negative.   Genitourinary:       Irregular menses  Musculoskeletal: Negative.   Skin: Negative.   Allergic/Immunologic: Negative.   Neurological: Negative.   Hematological: Negative.   Psychiatric/Behavioral: Negative.     Exam:   BP 100/70 (BP Location: Right Arm, Patient Position: Sitting, Cuff Size: Normal)   Pulse 88   Resp 16   Ht 5' 4.25" (  1.632 m)   Wt 147 lb (66.7 kg)   LMP 03/15/2018   BMI 25.04 kg/m     General appearance: alert, cooperative and appears stated age Head: Normocephalic, without obvious abnormality, atraumatic Neck: no adenopathy, supple, symmetrical, trachea midline and thyroid normal to inspection and palpation Lungs: clear to auscultation bilaterally Breasts: normal appearance, no masses or tenderness, No nipple retraction or dimpling, No nipple discharge or bleeding, No axillary or supraclavicular adenopathy Heart: regular rate and rhythm Abdomen: soft, non-tender; no masses, no  organomegaly Extremities: extremities normal, atraumatic, no cyanosis or edema Skin: Skin color, texture, turgor normal. No rashes or lesions Lymph nodes: Cervical, supraclavicular, and axillary nodes normal. No abnormal inguinal nodes palpated Neurologic: Grossly normal  Pelvic: External genitalia:  no lesions              Urethra:  normal appearing urethra with no masses, tenderness or lesions              Bartholins and Skenes: normal                 Vagina: normal appearing vagina with normal color and discharge, no lesions              Cervix: no lesions              Pap taken: Yes.   Bimanual Exam:  Uterus:  10 - 11 week size globular uterus.               Adnexa: no mass, fullness, tenderness              Rectal exam: Yes.  .  Confirms.              Anus:  normal sphincter tone, no lesions  Chaperone was present for exam.  Assessment:   Well woman visit with normal exam. Irregular menses.  Possible perimenopause.  Significant food allergies. Preference to avoid hormonal contraceptives and medications in general. Enlarged uterus.  I suspect fibroids.  FH early menopause.   Plan: Mammogram screening. Recommended self breast awareness. Pap and HR HPV as above. Guidelines for Calcium, Vitamin D, regular exercise program including cardiovascular and weight bearing exercise. Routine labs with PCP.  FSH, LH, E2, prolactin, TSH.  Discussion about perimenopause.  Reading material on menopause to patient.  I had a brief discussion of fibroids and gave reading material.  We will get a pelvic ultrasound.  Gardasil vaccine discussed.  Questions invited and answered.  Follow up annually and prn.   After visit summary provided.

## 2018-06-04 LAB — TSH: TSH: 1.28 u[IU]/mL (ref 0.450–4.500)

## 2018-06-04 LAB — FSH/LH
FSH: 3.1 m[IU]/mL
LH: 12.7 m[IU]/mL

## 2018-06-04 LAB — ESTRADIOL: Estradiol: 330.2 pg/mL

## 2018-06-04 LAB — PROLACTIN: Prolactin: 9 ng/mL (ref 4.8–23.3)

## 2018-06-05 ENCOUNTER — Encounter: Payer: Self-pay | Admitting: Obstetrics and Gynecology

## 2018-06-05 LAB — CYTOLOGY - PAP
Diagnosis: NEGATIVE
HPV (WINDOPATH): NOT DETECTED

## 2018-06-12 ENCOUNTER — Encounter: Payer: Self-pay | Admitting: Obstetrics and Gynecology

## 2018-06-12 ENCOUNTER — Ambulatory Visit (INDEPENDENT_AMBULATORY_CARE_PROVIDER_SITE_OTHER): Payer: BLUE CROSS/BLUE SHIELD

## 2018-06-12 ENCOUNTER — Other Ambulatory Visit: Payer: Self-pay

## 2018-06-12 ENCOUNTER — Ambulatory Visit (INDEPENDENT_AMBULATORY_CARE_PROVIDER_SITE_OTHER): Payer: BLUE CROSS/BLUE SHIELD | Admitting: Obstetrics and Gynecology

## 2018-06-12 VITALS — BP 100/60 | HR 88 | Resp 16 | Ht 64.25 in | Wt 144.2 lb

## 2018-06-12 DIAGNOSIS — N852 Hypertrophy of uterus: Secondary | ICD-10-CM | POA: Diagnosis not present

## 2018-06-12 DIAGNOSIS — D252 Subserosal leiomyoma of uterus: Secondary | ICD-10-CM

## 2018-06-12 NOTE — Progress Notes (Signed)
GYNECOLOGY  VISIT   HPI: 42 y.o.   Married  Caucasian  female   [phone] with Patient's last menstrual period was 06/06/2018.   here for  Pelvic US for irregular menses and enlarged uterus at time of exam on 06/03/18.  Period started just after her annual exam.   Labs reviewed from recent visit.   Prefers not to take medications.   GYNECOLOGIC HISTORY: Patient's last menstrual period was 06/06/2018. Contraception:  Condoms Menopausal hormone therapy:  none Last mammogram:  never Last pap smear:   06/03/18 Pap and HR HPV negative        OB History    Gravida  4   Para  2   Term  2   Preterm      AB  2   Living  2     SAB      TAB  2   Ectopic      Multiple      Live Births  2              Patient Active Problem List   Diagnosis Date Noted  . Enlarged uterus 06/03/2018  . Chest tightness 01/11/2016  . HEADACHE 11/06/2007    Past Medical History:  Diagnosis Date  . Anemia   . Anxiety   . GERD (gastroesophageal reflux disease)   . Headache(784.0)   . Migraine without aura   . Seasonal allergies   . Short bowel syndrome     Past Surgical History:  Procedure Laterality Date  . CARPAL TUNNEL RELEASE Right 2002  . CESAREAN SECTION  x 2  . PLANTAR FASCIA SURGERY Right   . WISDOM TOOTH EXTRACTION      Current Outpatient Medications  Medication Sig Dispense Refill  . EPIPEN 2-PAK 0.3 MG/0.3ML SOAJ injection     . levocetirizine (XYZAL) 5 MG tablet TAKE 1 TABLET (5 MG TOTAL) BY MOUTH EVERY EVENING. 30 tablet 3   No current facility-administered medications for this visit.      ALLERGIES: Amoxicillin; Other; Sulfa antibiotics; and Penicillins  Family History  Problem Relation Age of Onset  . Hypertension Mother   . Osteoporosis Mother   . Atrial fibrillation Mother   . Melanoma Father   . Hypertension Paternal Grandmother   . Hypertension Maternal Grandfather   . Alzheimer's disease Maternal Grandmother     Social History    Socioeconomic History  . Marital status: Married    Spouse name: Not on file  . Number of children: Not on file  . Years of education: Not on file  . Highest education level: Not on file  Occupational History  . Not on file  Social Needs  . Financial resource strain: Not on file  . Food insecurity:    Worry: Not on file    Inability: Not on file  . Transportation needs:    Medical: Not on file    Non-medical: Not on file  Tobacco Use  . Smoking status: Former Smoker    Years: 10.00    Types: Cigarettes    Last attempt to quit: 05/10/1994    Years since quitting: 24.1  . Smokeless tobacco: Never Used  Substance and Sexual Activity  . Alcohol use: Yes    Alcohol/week: 1.5 oz    Types: 3 Standard drinks or equivalent per week    Comment: occasionally  . Drug use: No  . Sexual activity: Yes    Partners: Male    Birth control/protection: Condom  Lifestyle  . Physical activity:    Days per week: Not on file    Minutes per session: Not on file  . Stress: Not on file  Relationships  . Social connections:    Talks on phone: Not on file    Gets together: Not on file    Attends religious service: Not on file    Active member of club or organization: Not on file    Attends meetings of clubs or organizations: Not on file    Relationship status: Not on file  . Intimate partner violence:    Fear of current or ex partner: Not on file    Emotionally abused: Not on file    Physically abused: Not on file    Forced sexual activity: Not on file  Other Topics Concern  . Not on file  Social History Narrative  . Not on file    Review of Systems  Constitutional: Negative.   HENT: Negative.   Eyes: Negative.   Respiratory: Negative.   Cardiovascular: Negative.   Gastrointestinal: Negative.   Endocrine: Negative.   Genitourinary: Negative.   Musculoskeletal: Negative.   Skin: Negative.   Allergic/Immunologic: Negative.   Neurological: Negative.   Hematological: Negative.    Psychiatric/Behavioral: Negative.     PHYSICAL EXAMINATION:    BP 100/60 (BP Location: Right Arm, Patient Position: Sitting, Cuff Size: Normal)   Pulse 88   Resp 16   Ht 5' 4.25" (1.632 m)   Wt 144 lb 3.2 oz (65.4 kg)   LMP 06/06/2018   BMI 24.56 kg/m     General appearance: alert, cooperative and appears stated age   Pelvic US - see above.  1.8 cm subserous fibroid.  EMS 10.72 mm.  Bilateral ovarian follicles.   ASSESSMENT  Small uterine fibroid.  Skipped menstrual cycles.   Now returned.   PLAN  Uterine fibroids discussed - generally benign nature, life history, signs, symptoms, treatments.  Call for bleeding abnormalities or any other concern.  Written information also given. Fu prn.    An After Visit Summary was printed and given to the patient.  ___15___ minutes face to face time of which over 50% was spent in counseling.

## 2018-06-12 NOTE — Patient Instructions (Signed)
Uterine Fibroids Uterine fibroids are tissue masses (tumors) that can develop in the womb (uterus). They are also called leiomyomas. This type of tumor is not cancerous (benign) and does not spread to other parts of the body outside of the pelvic area, which is between the hip bones. Occasionally, fibroids may develop in the fallopian tubes, in the cervix, or on the support structures (ligaments) that surround the uterus. You can have one or many fibroids. Fibroids can vary in size, weight, and where they grow in the uterus. Some can become quite large. Most fibroids do not require medical treatment. What are the causes? A fibroid can develop when a single uterine cell keeps growing (replicating). Most cells in the human body have a control mechanism that keeps them from replicating without control. What are the signs or symptoms? Symptoms may include:  Heavy bleeding during your period.  Bleeding or spotting between periods.  Pelvic pain and pressure.  Bladder problems, such as needing to urinate more often (urinary frequency) or urgently.  Inability to reproduce offspring (infertility).  Miscarriages.  How is this diagnosed? Uterine fibroids are diagnosed through a physical exam. Your health care provider may feel the lumpy tumors during a pelvic exam. Ultrasonography and an MRI may be done to determine the size, location, and number of fibroids. How is this treated? Treatment may include:  Watchful waiting. This involves getting the fibroid checked by your health care provider to see if it grows or shrinks. Follow your health care provider's recommendations for how often to have this checked.  Hormone medicines. These can be taken by mouth or given through an intrauterine device (IUD).  Surgery. ? Removing the fibroids (myomectomy) or the uterus (hysterectomy). ? Removing blood supply to the fibroids (uterine artery embolization).  If fibroids interfere with your fertility and you  want to become pregnant, your health care provider may recommend having the fibroids removed. Follow these instructions at home:  Keep all follow-up visits as directed by your health care provider. This is important.  Take over-the-counter and prescription medicines only as told by your health care provider. ? If you were prescribed a hormone treatment, take the hormone medicines exactly as directed.  Ask your health care provider about taking iron pills and increasing the amount of dark green, leafy vegetables in your diet. These actions can help to boost your blood iron levels, which may be affected by heavy menstrual bleeding.  Pay close attention to your period and tell your health care provider about any changes, such as: ? Increased blood flow that requires you to use more pads or tampons than usual per month. ? A change in the number of days that your period lasts per month. ? A change in symptoms that are associated with your period, such as abdominal cramping or back pain. Contact a health care provider if:  You have pelvic pain, back pain, or abdominal cramps that cannot be controlled with medicines.  You have an increase in bleeding between and during periods.  You soak tampons or pads in a half hour or less.  You feel lightheaded, extra tired, or weak. Get help right away if:  You faint.  You have a sudden increase in pelvic pain. This information is not intended to replace advice given to you by your health care provider. Make sure you discuss any questions you have with your health care provider. Document Released: 12/14/2000 Document Revised: 08/16/2016 Document Reviewed: 06/15/2014 Elsevier Interactive Patient Education  2018 [employer]  

## 2018-06-25 ENCOUNTER — Ambulatory Visit: Payer: BLUE CROSS/BLUE SHIELD

## 2018-06-30 ENCOUNTER — Ambulatory Visit
Admission: RE | Admit: 2018-06-30 | Discharge: 2018-06-30 | Disposition: A | Discharge status: Home / Self Care | Payer: BLUE CROSS/BLUE SHIELD | Source: Ambulatory Visit | Attending: Obstetrics and Gynecology | Admitting: Obstetrics and Gynecology

## 2018-06-30 DIAGNOSIS — Z1231 Encounter for screening mammogram for malignant neoplasm of breast: Secondary | ICD-10-CM | POA: Diagnosis not present

## 2018-12-05 DIAGNOSIS — M25361 Other instability, right knee: Secondary | ICD-10-CM | POA: Diagnosis not present

## 2018-12-05 DIAGNOSIS — M25561 Pain in right knee: Secondary | ICD-10-CM | POA: Diagnosis not present

## 2018-12-22 DIAGNOSIS — M25561 Pain in right knee: Secondary | ICD-10-CM | POA: Diagnosis not present

## 2019-08-10 ENCOUNTER — Other Ambulatory Visit: Payer: Self-pay | Admitting: Obstetrics and Gynecology

## 2019-08-10 DIAGNOSIS — Z1231 Encounter for screening mammogram for malignant neoplasm of breast: Secondary | ICD-10-CM

## 2019-09-03 ENCOUNTER — Ambulatory Visit
Admission: RE | Admit: 2019-09-03 | Discharge: 2019-09-03 | Disposition: A | Discharge status: Home / Self Care | Payer: BLUE CROSS/BLUE SHIELD | Source: Ambulatory Visit

## 2019-09-03 ENCOUNTER — Other Ambulatory Visit: Payer: Self-pay

## 2019-09-03 DIAGNOSIS — Z1231 Encounter for screening mammogram for malignant neoplasm of breast: Secondary | ICD-10-CM

## 2019-11-09 ENCOUNTER — Encounter (HOSPITAL_COMMUNITY): Payer: Self-pay

## 2019-11-09 ENCOUNTER — Encounter (HOSPITAL_COMMUNITY)
Admission: EM | Disposition: A | Discharge status: Home / Self Care | Payer: Self-pay | Source: Home / Self Care | Attending: Emergency Medicine

## 2019-11-09 ENCOUNTER — Observation Stay (HOSPITAL_COMMUNITY): Payer: BC Managed Care – PPO | Admitting: Anesthesiology

## 2019-11-09 ENCOUNTER — Emergency Department (HOSPITAL_COMMUNITY): Payer: BC Managed Care – PPO

## 2019-11-09 ENCOUNTER — Ambulatory Visit (HOSPITAL_COMMUNITY)
Admission: EM | Admit: 2019-11-09 | Discharge: 2019-11-09 | Disposition: A | Discharge status: Home / Self Care | Payer: BC Managed Care – PPO | Source: Home / Self Care | Attending: Family Medicine | Admitting: Family Medicine

## 2019-11-09 ENCOUNTER — Encounter (HOSPITAL_COMMUNITY): Payer: Self-pay | Admitting: Emergency Medicine

## 2019-11-09 ENCOUNTER — Other Ambulatory Visit: Payer: Self-pay

## 2019-11-09 ENCOUNTER — Observation Stay (HOSPITAL_COMMUNITY)
Admission: EM | Admit: 2019-11-09 | Discharge: 2019-11-10 | Disposition: A | Discharge status: Home / Self Care | Payer: BC Managed Care – PPO | Attending: Surgery | Admitting: Surgery

## 2019-11-09 DIAGNOSIS — Z880 Allergy status to penicillin: Secondary | ICD-10-CM | POA: Diagnosis not present

## 2019-11-09 DIAGNOSIS — K3532 Acute appendicitis with perforation and localized peritonitis, without abscess: Principal | ICD-10-CM | POA: Insufficient documentation

## 2019-11-09 DIAGNOSIS — K3533 Acute appendicitis with perforation and localized peritonitis, with abscess: Secondary | ICD-10-CM | POA: Diagnosis present

## 2019-11-09 DIAGNOSIS — K3580 Unspecified acute appendicitis: Secondary | ICD-10-CM | POA: Diagnosis present

## 2019-11-09 DIAGNOSIS — Z20828 Contact with and (suspected) exposure to other viral communicable diseases: Secondary | ICD-10-CM | POA: Diagnosis not present

## 2019-11-09 DIAGNOSIS — Z882 Allergy status to sulfonamides status: Secondary | ICD-10-CM | POA: Diagnosis not present

## 2019-11-09 DIAGNOSIS — R1030 Lower abdominal pain, unspecified: Secondary | ICD-10-CM | POA: Diagnosis not present

## 2019-11-09 DIAGNOSIS — Z79899 Other long term (current) drug therapy: Secondary | ICD-10-CM | POA: Insufficient documentation

## 2019-11-09 DIAGNOSIS — R1031 Right lower quadrant pain: Secondary | ICD-10-CM

## 2019-11-09 DIAGNOSIS — Z87891 Personal history of nicotine dependence: Secondary | ICD-10-CM | POA: Insufficient documentation

## 2019-11-09 HISTORY — PX: LAPAROSCOPIC APPENDECTOMY: SHX408

## 2019-11-09 HISTORY — PX: APPENDECTOMY: SHX54

## 2019-11-09 LAB — COMPREHENSIVE METABOLIC PANEL
ALT: 17 U/L (ref 0–44)
AST: 18 U/L (ref 15–41)
Albumin: 4 g/dL (ref 3.5–5.0)
Alkaline Phosphatase: 45 U/L (ref 38–126)
Anion gap: 9 (ref 5–15)
BUN: 11 mg/dL (ref 6–20)
CO2: 24 mmol/L (ref 22–32)
Calcium: 9 mg/dL (ref 8.9–10.3)
Chloride: 107 mmol/L (ref 98–111)
Creatinine, Ser: 0.84 mg/dL (ref 0.44–1.00)
GFR calc Af Amer: >60 mL/min (ref >60)
GFR calc non Af Amer: >60 mL/min (ref >60)
Glucose, Bld: 125 mg/dL — ABNORMAL HIGH (ref 70–99)
Potassium: 3.8 mmol/L (ref 3.5–5.1)
Sodium: 140 mmol/L (ref 135–145)
Total Bilirubin: 0.7 mg/dL (ref 0.3–1.2)
Total Protein: 6.9 g/dL (ref 6.5–8.1)

## 2019-11-09 LAB — CBC
HCT: 42 % (ref 36.0–46.0)
Hemoglobin: 14 g/dL (ref 12.0–15.0)
MCH: 31.3 pg (ref 26.0–34.0)
MCHC: 33.3 g/dL (ref 30.0–36.0)
MCV: 94 fL (ref 80.0–100.0)
Platelets: 168 10*3/uL (ref 150–400)
RBC: 4.47 MIL/uL (ref 3.87–5.11)
RDW: 12.6 % (ref 11.5–15.5)
WBC: 10.8 10*3/uL — ABNORMAL HIGH (ref 4.0–10.5)
nRBC: 0 % (ref 0.0–0.2)

## 2019-11-09 LAB — HIV ANTIBODY (ROUTINE TESTING W REFLEX): HIV Screen 4th Generation wRfx: NONREACTIVE

## 2019-11-09 LAB — I-STAT BETA HCG BLOOD, ED (MC, WL, AP ONLY): I-stat hCG, quantitative: <5 m[IU]/mL (ref <5)

## 2019-11-09 LAB — LIPASE, BLOOD: Lipase: 25 U/L (ref 11–51)

## 2019-11-09 LAB — SARS CORONAVIRUS 2 BY RT PCR (HOSPITAL ORDER, PERFORMED IN ~~LOC~~ HOSPITAL LAB): SARS Coronavirus 2: NEGATIVE

## 2019-11-09 SURGERY — APPENDECTOMY, LAPAROSCOPIC
Anesthesia: General | Site: Abdomen

## 2019-11-09 MED ORDER — METOPROLOL TARTRATE 5 MG/5ML IV SOLN: 5.0000 mg | Freq: Four times a day (QID) | INTRAVENOUS | Status: DC | PRN

## 2019-11-09 MED ORDER — HYDROMORPHONE HCL 1 MG/ML IJ SOLN
1.0000 mg | Freq: Once | INTRAMUSCULAR | Status: AC
  Administered 2019-11-09: 1 mg via INTRAVENOUS
  Filled 2019-11-09: qty 1

## 2019-11-09 MED ORDER — METRONIDAZOLE IN NACL 5-0.79 MG/ML-% IV SOLN
500.0000 mg | Freq: Once | INTRAVENOUS | Status: AC
  Administered 2019-11-09: 16:00:00 500 mg via INTRAVENOUS
  Filled 2019-11-09: qty 100

## 2019-11-09 MED ORDER — MIDAZOLAM HCL 2 MG/2ML IJ SOLN
INTRAMUSCULAR | Status: AC
  Filled 2019-11-09: qty 2

## 2019-11-09 MED ORDER — KETOROLAC TROMETHAMINE 30 MG/ML IJ SOLN
INTRAMUSCULAR | Status: DC | PRN
  Administered 2019-11-09: 30 mg via INTRAVENOUS

## 2019-11-09 MED ORDER — HYDROMORPHONE HCL 1 MG/ML IJ SOLN
INTRAMUSCULAR | Status: AC
  Filled 2019-11-09: qty 0.5

## 2019-11-09 MED ORDER — ACETAMINOPHEN 650 MG RE SUPP: 650.0000 mg | Freq: Four times a day (QID) | RECTAL | Status: DC | PRN

## 2019-11-09 MED ORDER — FENTANYL CITRATE (PF) 100 MCG/2ML IJ SOLN
INTRAMUSCULAR | Status: DC | PRN
  Administered 2019-11-09: 50 ug via INTRAVENOUS
  Administered 2019-11-09: 100 ug via INTRAVENOUS
  Administered 2019-11-09 (×2): 50 ug via INTRAVENOUS

## 2019-11-09 MED ORDER — CELECOXIB 200 MG PO CAPS
ORAL_CAPSULE | ORAL | Status: AC
  Administered 2019-11-09: 17:00:00 200 mg via ORAL
  Filled 2019-11-09: qty 1

## 2019-11-09 MED ORDER — ONDANSETRON HCL 4 MG/2ML IJ SOLN
4.0000 mg | Freq: Once | INTRAMUSCULAR | Status: AC
  Administered 2019-11-09: 12:00:00 4 mg via INTRAVENOUS
  Filled 2019-11-09: qty 2

## 2019-11-09 MED ORDER — METHOCARBAMOL 500 MG PO TABS: 500.0000 mg | ORAL_TABLET | Freq: Four times a day (QID) | ORAL | Status: DC | PRN

## 2019-11-09 MED ORDER — DIPHENHYDRAMINE HCL 25 MG PO CAPS: 25.0000 mg | ORAL_CAPSULE | Freq: Four times a day (QID) | ORAL | Status: DC | PRN

## 2019-11-09 MED ORDER — 0.9 % SODIUM CHLORIDE (POUR BTL) OPTIME
TOPICAL | Status: DC | PRN
  Administered 2019-11-09: 18:00:00 1000 mL

## 2019-11-09 MED ORDER — MIDAZOLAM HCL 5 MG/5ML IJ SOLN
INTRAMUSCULAR | Status: DC | PRN
  Administered 2019-11-09: 2 mg via INTRAVENOUS

## 2019-11-09 MED ORDER — ACETAMINOPHEN 500 MG PO TABS
ORAL_TABLET | ORAL | Status: AC
  Administered 2019-11-09: 17:00:00 1000 mg via ORAL
  Filled 2019-11-09: qty 2

## 2019-11-09 MED ORDER — SODIUM CHLORIDE 0.9 % IV SOLN
2.0000 g | Freq: Once | INTRAVENOUS | Status: AC
  Administered 2019-11-09: 15:00:00 2 g via INTRAVENOUS
  Filled 2019-11-09: qty 20

## 2019-11-09 MED ORDER — MORPHINE SULFATE (PF) 4 MG/ML IV SOLN
4.0000 mg | Freq: Once | INTRAVENOUS | Status: AC
  Administered 2019-11-09: 4 mg via INTRAVENOUS
  Filled 2019-11-09: qty 1

## 2019-11-09 MED ORDER — ACETAMINOPHEN 325 MG PO TABS: 650.0000 mg | ORAL_TABLET | Freq: Four times a day (QID) | ORAL | Status: DC | PRN

## 2019-11-09 MED ORDER — PANTOPRAZOLE SODIUM 40 MG IV SOLR
40.0000 mg | Freq: Every day | INTRAVENOUS | Status: DC
  Administered 2019-11-09: 23:00:00 40 mg via INTRAVENOUS
  Filled 2019-11-09: qty 40

## 2019-11-09 MED ORDER — BUPIVACAINE-EPINEPHRINE 0.5% -1:200000 IJ SOLN
INTRAMUSCULAR | Status: DC | PRN
  Administered 2019-11-09: 38 mL

## 2019-11-09 MED ORDER — SIMETHICONE 80 MG PO CHEW: 40.0000 mg | CHEWABLE_TABLET | Freq: Four times a day (QID) | ORAL | Status: DC | PRN

## 2019-11-09 MED ORDER — CHLORHEXIDINE GLUCONATE CLOTH 2 % EX PADS: 6.0000 | MEDICATED_PAD | Freq: Once | CUTANEOUS | Status: DC

## 2019-11-09 MED ORDER — SODIUM CHLORIDE 0.9% FLUSH: 3.0000 mL | Freq: Once | INTRAVENOUS | Status: DC

## 2019-11-09 MED ORDER — ACETAMINOPHEN 500 MG PO TABS
1000.0000 mg | ORAL_TABLET | ORAL | Status: AC
  Administered 2019-11-09: 17:00:00 1000 mg via ORAL

## 2019-11-09 MED ORDER — HYDROMORPHONE HCL 1 MG/ML IJ SOLN: 1.0000 mg | INTRAMUSCULAR | Status: DC | PRN

## 2019-11-09 MED ORDER — ACETAMINOPHEN 160 MG/5ML PO SOLN: 1000.0000 mg | Freq: Once | ORAL | Status: DC | PRN

## 2019-11-09 MED ORDER — ONDANSETRON 4 MG PO TBDP: 4.0000 mg | ORAL_TABLET | Freq: Four times a day (QID) | ORAL | Status: DC | PRN

## 2019-11-09 MED ORDER — FENTANYL CITRATE (PF) 250 MCG/5ML IJ SOLN
INTRAMUSCULAR | Status: AC
  Filled 2019-11-09: qty 5

## 2019-11-09 MED ORDER — OXYCODONE HCL 5 MG PO TABS: 5.0000 mg | ORAL_TABLET | Freq: Once | ORAL | Status: DC | PRN

## 2019-11-09 MED ORDER — CHLORHEXIDINE GLUCONATE CLOTH 2 % EX PADS
6.0000 | MEDICATED_PAD | Freq: Once | CUTANEOUS | Status: AC
  Administered 2019-11-09: 17:00:00 6 via TOPICAL

## 2019-11-09 MED ORDER — OXYCODONE HCL 5 MG/5ML PO SOLN: 5.0000 mg | Freq: Once | ORAL | Status: DC | PRN

## 2019-11-09 MED ORDER — POLYETHYLENE GLYCOL 3350 17 G PO PACK: 17.0000 g | PACK | Freq: Every day | ORAL | Status: DC | PRN

## 2019-11-09 MED ORDER — BUPIVACAINE-EPINEPHRINE 0.5% -1:200000 IJ SOLN
INTRAMUSCULAR | Status: AC
  Filled 2019-11-09: qty 1

## 2019-11-09 MED ORDER — SODIUM CHLORIDE 0.9 % IV BOLUS
1000.0000 mL | Freq: Once | INTRAVENOUS | Status: AC
  Administered 2019-11-09: 1000 mL via INTRAVENOUS

## 2019-11-09 MED ORDER — DEXAMETHASONE SODIUM PHOSPHATE 10 MG/ML IJ SOLN
INTRAMUSCULAR | Status: DC | PRN
  Administered 2019-11-09: 10 mg via INTRAVENOUS

## 2019-11-09 MED ORDER — POTASSIUM CHLORIDE IN NACL 20-0.45 MEQ/L-% IV SOLN
INTRAVENOUS | Status: DC
  Administered 2019-11-09: 20:00:00 via INTRAVENOUS
  Filled 2019-11-09 (×2): qty 1000

## 2019-11-09 MED ORDER — SODIUM CHLORIDE 0.9 % IR SOLN
Status: DC | PRN
  Administered 2019-11-09 (×2): 1000 mL

## 2019-11-09 MED ORDER — PROPOFOL 10 MG/ML IV BOLUS
INTRAVENOUS | Status: DC | PRN
  Administered 2019-11-09: 170 mg via INTRAVENOUS

## 2019-11-09 MED ORDER — LIDOCAINE 2% (20 MG/ML) 5 ML SYRINGE
INTRAMUSCULAR | Status: DC | PRN
  Administered 2019-11-09: 60 mg via INTRAVENOUS

## 2019-11-09 MED ORDER — OXYCODONE HCL 5 MG PO TABS: 5.0000 mg | ORAL_TABLET | ORAL | Status: DC | PRN

## 2019-11-09 MED ORDER — IOHEXOL 300 MG/ML  SOLN
100.0000 mL | Freq: Once | INTRAMUSCULAR | Status: AC | PRN
  Administered 2019-11-09: 13:00:00 100 mL via INTRAVENOUS

## 2019-11-09 MED ORDER — LACTATED RINGERS IV SOLN
Freq: Once | INTRAVENOUS | Status: AC
  Administered 2019-11-09 (×2): via INTRAVENOUS

## 2019-11-09 MED ORDER — ACETAMINOPHEN 500 MG PO TABS: 1000.0000 mg | ORAL_TABLET | Freq: Once | ORAL | Status: DC | PRN

## 2019-11-09 MED ORDER — FENTANYL CITRATE (PF) 100 MCG/2ML IJ SOLN: 25.0000 ug | INTRAMUSCULAR | Status: DC | PRN

## 2019-11-09 MED ORDER — CIPROFLOXACIN IN D5W 400 MG/200ML IV SOLN
400.0000 mg | Freq: Two times a day (BID) | INTRAVENOUS | Status: DC
  Administered 2019-11-09 – 2019-11-10 (×2): 400 mg via INTRAVENOUS
  Filled 2019-11-09 (×3): qty 200

## 2019-11-09 MED ORDER — ONDANSETRON HCL 4 MG/2ML IJ SOLN
INTRAMUSCULAR | Status: DC | PRN
  Administered 2019-11-09: 4 mg via INTRAVENOUS

## 2019-11-09 MED ORDER — SUGAMMADEX SODIUM 200 MG/2ML IV SOLN
INTRAVENOUS | Status: DC | PRN
  Administered 2019-11-09: 200 mg via INTRAVENOUS

## 2019-11-09 MED ORDER — ROCURONIUM BROMIDE 50 MG/5ML IV SOSY
PREFILLED_SYRINGE | INTRAVENOUS | Status: DC | PRN
  Administered 2019-11-09: 50 mg via INTRAVENOUS

## 2019-11-09 MED ORDER — CELECOXIB 200 MG PO CAPS
200.0000 mg | ORAL_CAPSULE | ORAL | Status: AC
  Administered 2019-11-09: 17:00:00 200 mg via ORAL
  Filled 2019-11-09: qty 1

## 2019-11-09 MED ORDER — HYDROMORPHONE HCL 1 MG/ML IJ SOLN
INTRAMUSCULAR | Status: DC | PRN
  Administered 2019-11-09: 0.5 mg via INTRAVENOUS

## 2019-11-09 MED ORDER — DIPHENHYDRAMINE HCL 50 MG/ML IJ SOLN: 25.0000 mg | Freq: Four times a day (QID) | INTRAMUSCULAR | Status: DC | PRN

## 2019-11-09 MED ORDER — DOCUSATE SODIUM 100 MG PO CAPS
100.0000 mg | ORAL_CAPSULE | Freq: Two times a day (BID) | ORAL | Status: DC
  Administered 2019-11-09 – 2019-11-10 (×2): 100 mg via ORAL
  Filled 2019-11-09 (×2): qty 1

## 2019-11-09 MED ORDER — ENOXAPARIN SODIUM 40 MG/0.4ML ~~LOC~~ SOLN
40.0000 mg | SUBCUTANEOUS | Status: DC
  Administered 2019-11-10: 40 mg via SUBCUTANEOUS
  Filled 2019-11-09: qty 0.4

## 2019-11-09 MED ORDER — PROPOFOL 10 MG/ML IV BOLUS
INTRAVENOUS | Status: AC
  Filled 2019-11-09: qty 20

## 2019-11-09 MED ORDER — ACETAMINOPHEN 10 MG/ML IV SOLN: 1000.0000 mg | Freq: Once | INTRAVENOUS | Status: DC | PRN

## 2019-11-09 MED ORDER — PROMETHAZINE HCL 25 MG/ML IJ SOLN: 12.5000 mg | Freq: Four times a day (QID) | INTRAMUSCULAR | Status: DC | PRN

## 2019-11-09 MED ORDER — GABAPENTIN 300 MG PO CAPS
300.0000 mg | ORAL_CAPSULE | ORAL | Status: AC
  Administered 2019-11-09: 17:00:00 300 mg via ORAL
  Filled 2019-11-09: qty 1

## 2019-11-09 MED ORDER — ONDANSETRON HCL 4 MG/2ML IJ SOLN: 4.0000 mg | Freq: Four times a day (QID) | INTRAMUSCULAR | Status: DC | PRN

## 2019-11-09 SURGICAL SUPPLY — 48 items
ADH SKN CLS APL DERMABOND .7 (GAUZE/BANDAGES/DRESSINGS) ×1
APL PRP STRL LF DISP 70% ISPRP (MISCELLANEOUS) ×1
APL SKNCLS STERI-STRIP NONHPOA (GAUZE/BANDAGES/DRESSINGS) ×1
BAG SPEC RTRVL 10 TROC 200 (ENDOMECHANICALS) ×1
BENZOIN TINCTURE PRP APPL 2/3 (GAUZE/BANDAGES/DRESSINGS) ×2 IMPLANT
BNDG ADH 1X3 SHEER STRL LF (GAUZE/BANDAGES/DRESSINGS) ×4 IMPLANT
BNDG ADH THN 3X1 STRL LF (GAUZE/BANDAGES/DRESSINGS) ×2
CANISTER SUCT 3000ML PPV (MISCELLANEOUS) ×3 IMPLANT
CHLORAPREP W/TINT 26 (MISCELLANEOUS) ×3 IMPLANT
CLOSURE WOUND 1/2 X4 (GAUZE/BANDAGES/DRESSINGS) ×1
COVER SURGICAL LIGHT HANDLE (MISCELLANEOUS) ×3 IMPLANT
CUTTER FLEX LINEAR 45M (STAPLE) ×3 IMPLANT
DERMABOND ADVANCED (GAUZE/BANDAGES/DRESSINGS) ×2
DERMABOND ADVANCED .7 DNX12 (GAUZE/BANDAGES/DRESSINGS) IMPLANT
DRSG TEGADERM 2-3/8X2-3/4 SM (GAUZE/BANDAGES/DRESSINGS) ×2 IMPLANT
ELECT REM PT RETURN 9FT ADLT (ELECTROSURGICAL) ×3
ELECTRODE REM PT RTRN 9FT ADLT (ELECTROSURGICAL) ×1 IMPLANT
GAUZE SPONGE 2X2 8PLY STRL LF (GAUZE/BANDAGES/DRESSINGS) IMPLANT
GLOVE BIOGEL M STRL SZ7.5 (GLOVE) ×3 IMPLANT
GLOVE INDICATOR 8.0 STRL GRN (GLOVE) ×6 IMPLANT
GOWN STRL REUS W/ TWL LRG LVL3 (GOWN DISPOSABLE) ×2 IMPLANT
GOWN STRL REUS W/TWL 2XL LVL3 (GOWN DISPOSABLE) ×3 IMPLANT
GOWN STRL REUS W/TWL LRG LVL3 (GOWN DISPOSABLE) ×6
GRASPER SUT TROCAR 14GX15 (MISCELLANEOUS) ×2 IMPLANT
KIT BASIN OR (CUSTOM PROCEDURE TRAY) ×3 IMPLANT
KIT TURNOVER KIT B (KITS) ×3 IMPLANT
NS IRRIG 1000ML POUR BTL (IV SOLUTION) ×3 IMPLANT
PAD ARMBOARD 7.5X6 YLW CONV (MISCELLANEOUS) ×6 IMPLANT
POUCH RETRIEVAL ECOSAC 10 (ENDOMECHANICALS) IMPLANT
POUCH RETRIEVAL ECOSAC 10MM (ENDOMECHANICALS) ×2
RELOAD 45 VASCULAR/THIN (ENDOMECHANICALS) ×6 IMPLANT
RELOAD STAPLE 45 2.5 WHT GRN (ENDOMECHANICALS) IMPLANT
SET IRRIG TUBING LAPAROSCOPIC (IRRIGATION / IRRIGATOR) ×3 IMPLANT
SET TUBE SMOKE EVAC HIGH FLOW (TUBING) ×3 IMPLANT
SHEARS HARMONIC ACE PLUS 36CM (ENDOMECHANICALS) ×3 IMPLANT
SLEEVE ENDOPATH XCEL 5M (ENDOMECHANICALS) ×3 IMPLANT
SPECIMEN JAR SMALL (MISCELLANEOUS) ×3 IMPLANT
SPONGE GAUZE 2X2 STER 10/PKG (GAUZE/BANDAGES/DRESSINGS) ×2
STRIP CLOSURE SKIN 1/2X4 (GAUZE/BANDAGES/DRESSINGS) ×1 IMPLANT
SUT MNCRL AB 4-0 PS2 18 (SUTURE) ×3 IMPLANT
SUT VICRYL 0 UR6 27IN ABS (SUTURE) ×4 IMPLANT
TOWEL GREEN STERILE (TOWEL DISPOSABLE) ×3 IMPLANT
TRAY FOLEY W/BAG SLVR 16FR (SET/KITS/TRAYS/PACK) ×3
TRAY FOLEY W/BAG SLVR 16FR ST (SET/KITS/TRAYS/PACK) ×1 IMPLANT
TRAY LAPAROSCOPIC MC (CUSTOM PROCEDURE TRAY) ×3 IMPLANT
TROCAR XCEL BLUNT TIP 100MML (ENDOMECHANICALS) ×3 IMPLANT
TROCAR XCEL NON-BLD 5MMX100MML (ENDOMECHANICALS) ×3 IMPLANT
WATER STERILE IRR 1000ML POUR (IV SOLUTION) ×3 IMPLANT

## 2019-11-09 NOTE — Op Note (Signed)
ANDREAH GROTTE [id number] [birth year] 11/09/2019  Appendectomy, Lap, Procedure Note  Indications: The patient presented with a history of right-sided abdominal pain. A CT revealed findings consistent with acute appendicitis.  Pre-operative Diagnosis: acute appendicitis  Post-operative Diagnosis: feculent perforated acute appendicitis  Surgeon: Greer Pickerel MD FACS  Assistants: none  Anesthesia: General endotracheal anesthesia  ASA Class: 1, E  Procedure Details  The patient was seen again in the Holding Room. The risks, benefits, complications, treatment options, and expected outcomes were discussed with the patient and/or family. The possibilities of perforation of viscus, bleeding, recurrent infection, the need for additional procedures, failure to diagnose a condition, and creating a complication requiring transfusion or operation were discussed. There was concurrence with the proposed plan and informed consent was obtained. The site of surgery was properly noted. The patient was taken to Operating Room, identified as YANIQUE KOLO and the procedure verified as Appendectomy. A Time Out was held and the above information confirmed.  The patient was placed in the supine position and general anesthesia was induced, along with placement of orogastric tube, SCDs, and a Foley catheter. The abdomen was prepped and draped in a sterile fashion. A 1.5 centimeter infraumbilical incision was made.  The umbilical stalk was elevated, and the midline fascia was incised with a #11 blade.  A Kelly clamp was used to confirm entrance into the peritoneal cavity.  A pursestring suture was passed around the incision with a 0 Vicryl.  A 30mm Hasson was introduced into the abdomen and the tails of the suture were used to hold the Hasson in place.   The pneumoperitoneum was then established to steady pressure of 15 mmHg.  Additional 5 mm cannulas then placed in the left lower quadrant of the abdomen and  the suprapubic region under direct visualization. A careful evaluation of the entire abdomen was carried out. The patient was placed in Trendelenburg and left lateral decubitus position. The small intestines were retracted in the cephalad and left lateral direction away from the pelvis and right lower quadrant. The patient was found to have an severely inflammed appendix that was extending into the pelvis and adhered to right adnexal structures. When I peeled the appendix off of the Rt adnexa, there was evidenced of contained perforation against the adnexa structure. There was spillage of brownish fluid from the midportion of the appendix where it had perforated. There was evidence of perforation.  The appendix was carefully dissected. The appendix was was skeletonized with the harmonic scalpel.   The appendix was divided at its base using an endo-GIA stapler with a white load x2. No appendiceal stump was left in place. The appendix was removed from the abdomen with an Endocatch bag through the umbilical port.  There was no evidence of bleeding, leakage, or complication after division of the appendix. 2L of Irrigation was also performed and irrigate suctioned from the abdomen as well.  The umbilical port site was closed with the purse string suture. An additional interrupted 0 vicryl suture was placed at the umbilical fascia using PMI suture passer with lap guidance. Local was infiltrated in bilateral lateral abdominal walls as a TAP block.  The closure was viewed laparoscopically. There was no residual palpable fascial defect.  The trocar site skin wounds were closed with 4-0 Monocryl. Benzoin, steri-strips and bandages were applied to the skin incisions.  Instrument, sponge, and needle counts were correct at the conclusion of the case.   Findings: The appendix was found to be inflamed. There  were not signs of necrosis.  There was perforation. There was not abscess formation.  Estimated Blood Loss:   Minimal         Drains: none         Specimens: appendix         Complications:  None; patient tolerated the procedure well.         Disposition: PACU - hemodynamically stable.         Condition: stable  Leighton Ruff. Redmond Pulling, MD, FACS General, Bariatric, & Minimally Invasive Surgery [facility], [place]

## 2019-11-09 NOTE — Discharge Instructions (Signed)
Please go to the ER for further evaluation of your RLQ pain.

## 2019-11-09 NOTE — Anesthesia Preprocedure Evaluation (Addendum)
Anesthesia Evaluation  Patient identified by MRN, date of birth, ID band Patient awake    Reviewed: Allergy & Precautions, NPO status , Patient's Chart, lab work & pertinent test results  History of Anesthesia Complications Negative for: history of anesthetic complications  Airway Mallampati: II  TM Distance: >3 FB Neck ROM: Full    Dental  (+) Dental Advisory Given   Pulmonary neg pulmonary ROS, neg recent URI, former smoker,    breath sounds clear to auscultation       Cardiovascular negative cardio ROS   Rhythm:Regular     Neuro/Psych  Headaches, PSYCHIATRIC DISORDERS Anxiety    GI/Hepatic Neg liver ROS, GERD  ,APPENDECITIS   Endo/Other  Hypothyroidism   Renal/GU negative Renal ROS     Musculoskeletal negative musculoskeletal ROS (+)   Abdominal   Peds  Hematology negative hematology ROS (+)   Anesthesia Other Findings   Reproductive/Obstetrics                            Anesthesia Physical Anesthesia Plan  ASA: II  Anesthesia Plan: General   Post-op Pain Management:    Induction: Intravenous  PONV Risk Score and Plan: 3 and Ondansetron and Dexamethasone  Airway Management Planned: Oral ETT  Additional Equipment: None  Intra-op Plan:   Post-operative Plan: Extubation in OR  Informed Consent: I have reviewed the patients History and Physical, chart, labs and discussed the procedure including the risks, benefits and alternatives for the proposed anesthesia with the patient or authorized representative who has indicated his/her understanding and acceptance.     Dental advisory given  Plan Discussed with: CRNA and Surgeon  Anesthesia Plan Comments:         Anesthesia Quick Evaluation

## 2019-11-09 NOTE — H&P (Addendum)
[facility] Admission Note  Jody Parrish 02/24/1976  [id number].    Requesting MD: Virgel Manifold Chief Complaint/Reason for Consult: appendicitis  HPI:  Jody Parrish is a 43yo female PMH multiple allergies and food intolerances, who was sent from urgent care to [facility] for further evaluation of abdominal pain. Patient states that she started having generalized abdominal pain on Saturday. Associated with nausea, vomiting, diarrhea, and headache. Denies fever, chills, dysuria. Tried taking tylenol but this did not help her abdominal pain. Pain has now localized to the RLQ. It is constant, severe, and worse with movement or palpation.  CT scan shows acute appendicitis. WBC 10.8. General surgery asked to see.  Abdominal surgical history: c section x2 Anticoagulants: none Nonsmoker Employment: owns multiple [employer]  ROS: Review of Systems  Constitutional: Negative.   HENT: Negative.   Eyes: Negative.   Respiratory: Negative.   Cardiovascular: Negative.   Gastrointestinal: Positive for abdominal pain, diarrhea, nausea and vomiting. Negative for blood in stool, constipation and melena.  Genitourinary: Negative.   Musculoskeletal: Negative.   Skin: Negative.   Neurological: Positive for headaches.    All systems reviewed and otherwise negative except for as above  Family History  Problem Relation Age of Onset  . Hypertension Mother   . Osteoporosis Mother   . Atrial fibrillation Mother   . Melanoma Father   . Hypertension Paternal Grandmother   . Hypertension Maternal Grandfather   . Alzheimer's disease Maternal Grandmother     Past Medical History:  Diagnosis Date  . Anemia   . Anxiety   . GERD (gastroesophageal reflux disease)   . Headache(784.0)   . Migraine without aura   . Seasonal allergies   . Short bowel syndrome     Past Surgical History:  Procedure Laterality Date  . CARPAL TUNNEL RELEASE Right 2002  . CESAREAN SECTION  x 2  .  PLANTAR FASCIA SURGERY Right   . WISDOM TOOTH EXTRACTION      Social History:  reports that she quit smoking about 25 years ago. Her smoking use included cigarettes. She quit after 10.00 years of use. She has never used smokeless tobacco. She reports current alcohol use of about 3.0 standard drinks of alcohol per week. She reports that she does not use drugs.  Allergies:  Allergies  Allergen Reactions  . Amoxicillin Hives  . Clarithromycin Hives  . Other     Food intolerances -- Auto immune responses to foods (Gluten, Dairy, Corn, Nuts, Citrus, etc)  . Sulfa Antibiotics Nausea Only  . Penicillins Rash    Did it involve swelling of the face/tongue/throat, SOB, or low BP? Yes Did it involve sudden or severe rash/hives, skin peeling, or any reaction on the inside of your mouth or nose? Yes Did you need to seek medical attention at a hospital or doctor's office? Yes When did it last happen?Pt was 43 years old If all above answers are "NO", may proceed with cephalosporin use.     (Not in a hospital admission)   Prior to Admission medications   Medication Sig Start Date End Date Taking? Authorizing Provider  NON FORMULARY Take 1-2 capsules by mouth See admin instructions. t3 t4 thyroid medicine: Takes 1 capsule in the afternoon and 2 capsules in the middle of the night.   Yes [provider]  NON FORMULARY Take 1 capsule by mouth every evening. LDN medicine(low-dose naltrexone)   Yes [provider]    Blood pressure 114/66, pulse 96, temperature 98.4 F (  36.9 C), temperature source Oral, resp. rate 16, last menstrual period 10/29/2019, SpO2 100 %. Physical Exam: General: pleasant, WD/WN white female who is laying in bed in NAD HEENT: head is normocephalic, atraumatic.  Sclera are noninjected.  Pupils equal and round.  Ears and nose without any masses or lesions.  Mouth is pink and moist. Dentition fair Heart: tachy.  No obvious murmurs, gallops, or rubs noted.   Palpable pedal pulses bilaterally Lungs: CTAB, no wheezes, rhonchi, or rales noted.  Respiratory effort nonlabored Abd: soft, ND, +BS, no masses, hernias, or organomegaly. TTP RLQ with voluntary guarding MS: calves soft and nontender without edema Skin: warm and dry with no masses, lesions, or rashes Psych: A&Ox3 with an appropriate affect. Neuro: cranial nerves grossly intact, extremity CSM intact bilaterally, normal speech  Results for orders placed or performed during the hospital encounter of 11/09/19 (from the past 48 hour(s))  I-Stat beta hCG blood, ED     Status: None   Collection Time: 11/09/19 11:18 AM  Result Value Ref Range   I-stat hCG, quantitative <5.0 <5 mIU/mL   Comment 3            Comment:   GEST. AGE      CONC.  (mIU/mL)   <=1 WEEK        5 - 50     2 WEEKS       50 - 500     3 WEEKS       100 - 10,000     4 WEEKS     1,000 - 30,000        FEMALE AND NON-PREGNANT FEMALE:     LESS THAN 5 mIU/mL   Lipase, blood     Status: None   Collection Time: 11/09/19 11:23 AM  Result Value Ref Range   Lipase 25 11 - 51 U/L    Comment: Performed at [facility], [place] [address], [place],  [address]  Comprehensive metabolic panel     Status: Abnormal   Collection Time: 11/09/19 11:23 AM  Result Value Ref Range   Sodium 140 135 - 145 mmol/L   Potassium 3.8 3.5 - 5.1 mmol/L   Chloride 107 98 - 111 mmol/L   CO2 24 22 - 32 mmol/L   Glucose, Bld 125 (H) 70 - 99 mg/dL   BUN 11 6 - 20 mg/dL   Creatinine, Ser 0.84 0.44 - 1.00 mg/dL   Calcium 9.0 8.9 - 10.3 mg/dL   Total Protein 6.9 6.5 - 8.1 g/dL   Albumin 4.0 3.5 - 5.0 g/dL   AST 18 15 - 41 U/L   ALT 17 0 - 44 U/L   Alkaline Phosphatase 45 38 - 126 U/L   Total Bilirubin 0.7 0.3 - 1.2 mg/dL   GFR calc non Af Amer >60 >60 mL/min   GFR calc Af Amer >60 >60 mL/min   Anion gap 9 5 - 15    Comment: Performed at [facility], [place] [address], [place] [address]  CBC     Status: Abnormal   Collection Time:  11/09/19 11:23 AM  Result Value Ref Range   WBC 10.8 (H) 4.0 - 10.5 K/uL   RBC 4.47 3.87 - 5.11 MIL/uL   Hemoglobin 14.0 12.0 - 15.0 g/dL   HCT 42.0 36.0 - 46.0 %   MCV 94.0 80.0 - 100.0 fL   MCH 31.3 26.0 - 34.0 pg   MCHC 33.3 30.0 - 36.0 g/dL  RDW 12.6 11.5 - 15.5 %   Platelets 168 150 - 400 K/uL   nRBC 0.0 0.0 - 0.2 %    Comment: Performed at [facility], [place] [address] [place], [place] [address]   Ct Abdomen Pelvis W Contrast  Result Date: 11/09/2019 CLINICAL DATA:  Right lower quadrant abdominal pain for the past 2 days. Two previous C-sections. Clinical concern for appendicitis. EXAM: CT ABDOMEN AND PELVIS WITH CONTRAST TECHNIQUE: Multidetector CT imaging of the abdomen and pelvis was performed using the standard protocol following bolus administration of intravenous contrast. CONTRAST:  188mL OMNIPAQUE IOHEXOL 300 MG/ML  SOLN COMPARISON:  None. FINDINGS: Lower chest: Unremarkable. Hepatobiliary: No focal liver abnormality is seen. No gallstones, gallbladder wall thickening, or biliary dilatation. Pancreas: Unremarkable. No pancreatic ductal dilatation or surrounding inflammatory changes. Spleen: Normal in size without focal abnormality. Adrenals/Urinary Tract: Adrenal glands are unremarkable. Kidneys are normal, without renal calculi, focal lesion, or hydronephrosis. Bladder is unremarkable. Stomach/Bowel: Dilated appendix filled with fluid and with mild diffuse wall thickening and enhancement, measuring 14.3 mm in maximum diameter. There are 3 appendicoliths in the distal appendix, the largest measuring 8 mm in maximum diameter. Periappendiceal soft tissue stranding is noted with no visible abscess. The appendix is rising from the cecum in the upper pelvis on the right in the tip of the appendix is to the right of midline, medial to the distal right common iliac artery. Unremarkable stomach, small bowel and colon. Vascular/Lymphatic: No significant vascular findings are present. No  enlarged abdominal or pelvic lymph nodes. Reproductive: Uterus and bilateral adnexa are unremarkable. Other: Small amount of free peritoneal fluid in the pelvic cul-de-sac, within normal limits of physiological fluid. No free peritoneal air seen. Tiny umbilical hernia containing fat. Musculoskeletal: Unremarkable bones. IMPRESSION: Acute appendicitis without abscess. Electronically Signed   By: Claudie Revering M.D.   On: 11/09/2019 13:43      Assessment/Plan Multiple allergies and food intolerances - takes naltrexone  Acute appendicitis - Patient with clinical and radiographic findings consistent with acute appendicitis. Pending covid test will plan for laparoscopic appendectomy today. Start IV rocephin/flagyl. Keep NPO.  ID - rocephin/flagyl VTE - SCDs, lovenox FEN - IVF, NPO Foley - none Follow up - TBD   Wellington [place], [facility] 11/09/2019, 3:22 PM Please see Amion for pager number during day hours 7:00am-4:30pm

## 2019-11-09 NOTE — ED Provider Notes (Signed)
[facility]    CSN: [id number] Arrival date & time: 11/09/19  [id number]      History   Chief Complaint Chief Complaint  Patient presents with  . Abdominal Pain    HPI Jody Parrish is a 43 y.o. female.   Jody Parrish presents with complaints of abdominal pain which has been worsening. Started out feeling nausea two nights ago. She woke early yesterday morning at 0200 and vomited. Yesterday she had a bad headache and generalized low abdominal pain. Today after taking tylenol her headache has improved but worsening pain to her right lower quadrant. Movements worsen the pain. No diarrhea. No fevers. She has had two c-sections but no other previous abdominal surgeries. No known ill contacts. She works in an office. History  Of anemia, anxiety, gerd, headache, migraines, allergies.    ROS per HPI, negative if not otherwise mentioned.      Past Medical History:  Diagnosis Date  . Anemia   . Anxiety   . GERD (gastroesophageal reflux disease)   . Headache(784.0)   . Migraine without aura   . Seasonal allergies   . Short bowel syndrome     Patient Active Problem List   Diagnosis Date Noted  . Fibroids, subserous 06/12/2018  . Enlarged uterus 06/03/2018  . Chest tightness 01/11/2016  . HEADACHE 11/06/2007    Past Surgical History:  Procedure Laterality Date  . CARPAL TUNNEL RELEASE Right 2002  . CESAREAN SECTION  x 2  . PLANTAR FASCIA SURGERY Right   . WISDOM TOOTH EXTRACTION      OB History    Gravida  4   Para  2   Term  2   Preterm      AB  2   Living  2     SAB      TAB  2   Ectopic      Multiple      Live Births  2            Home Medications    Prior to Admission medications   Medication Sig Start Date End Date Taking? Authorizing Provider  NON FORMULARY t3 t4 thyroid medicine   Yes [provider]  NON FORMULARY LDN   Yes [provider]  EPIPEN 2-PAK 0.3 MG/0.3ML SOAJ injection  06/02/14    [provider]  levocetirizine (XYZAL) 5 MG tablet TAKE 1 TABLET (5 MG TOTAL) BY MOUTH EVERY EVENING. 05/14/17   Ann Held, DO    Family History Family History  Problem Relation Age of Onset  . Hypertension Mother   . Osteoporosis Mother   . Atrial fibrillation Mother   . Melanoma Father   . Hypertension Paternal Grandmother   . Hypertension Maternal Grandfather   . Alzheimer's disease Maternal Grandmother     Social History Social History   Tobacco Use  . Smoking status: Former Smoker    Years: 10.00    Types: Cigarettes    Quit date: 05/10/1994    Years since quitting: 25.5  . Smokeless tobacco: Never Used  Substance Use Topics  . Alcohol use: Yes    Alcohol/week: 3.0 standard drinks    Types: 3 Standard drinks or equivalent per week    Comment: occasionally  . Drug use: No     Allergies   Amoxicillin, Other, Sulfa antibiotics, and Penicillins   Review of Systems Review of Systems   Physical Exam Triage Vital Signs ED Triage Vitals  Enc Vitals  Group     BP 11/09/19 0955 112/73     Pulse Rate 11/09/19 0955 (!) 110     Resp 11/09/19 0955 18     Temp 11/09/19 0955 98.1 F (36.7 C)     Temp Source 11/09/19 0955 Oral     SpO2 11/09/19 0955 100 %     Weight --      Height --      Head Circumference --      Peak Flow --      Pain Score 11/09/19 0948 9     Pain Loc --      Pain Edu? --      Excl. in [place]? --    No data found.  Updated Vital Signs BP 112/73 (BP Location: Left Arm)   Pulse (!) 110   Temp 98.1 F (36.7 C) (Oral)   Resp 18   LMP 10/29/2019   SpO2 100%    Physical Exam Constitutional:      General: She is not in acute distress.    Appearance: She is well-developed.  Cardiovascular:     Rate and Rhythm: Tachycardia present.  Pulmonary:     Effort: Pulmonary effort is normal.  Abdominal:     Palpations: Abdomen is soft.     Tenderness: There is abdominal tenderness in the right lower quadrant and left lower  quadrant. There is no rebound.     Comments: RLQ pain > LLQ pain; pain with hop while standing and pain with straight leg heel strike, to RLQ   Skin:    General: Skin is warm and dry.  Neurological:     Mental Status: She is alert and oriented to person, place, and time.      UC Treatments / Results  Labs (all labs ordered are listed, but only abnormal results are displayed) Labs Reviewed  SARS CORONAVIRUS 2 (TAT 6-24 HRS)    EKG   Radiology No results found.  Procedures Procedures (including critical care time)  Medications Ordered in UC Medications - No data to display  Initial Impression / Assessment and Plan / UC Course  I have reviewed the triage vital signs and the nursing notes.  Pertinent labs & imaging results that were available during my care of the patient were reviewed by me and considered in my medical decision making (see chart for details).     Mild tachycardia and worsening of RLQ pain with precipitating nausea and vomiting. Afebrile, had taken tylenol earlier today. Appendicitis considered, therefore recommended to go to Er for further evaluation; gastroenteritis vs ovarian cyst vs viral illness also considered. Patient ambulatory out of clinic without difficulty and safe for self transport.  Final Clinical Impressions(s) / UC Diagnoses   Final diagnoses:  Lower abdominal pain  RLQ abdominal pain     Discharge Instructions     Please go to the ER for further evaluation of your RLQ pain.    ED Prescriptions    None     PDMP not reviewed this encounter.   Zigmund Gottron, NP 11/09/19 1011

## 2019-11-09 NOTE — Interval H&P Note (Signed)
History and Physical Interval Note:  11/09/2019 6:03 PM  Jody Parrish  has presented today for surgery, with the diagnosis of APPENDECITIS.  The various methods of treatment have been discussed with the patient and family. After consideration of risks, benefits and other options for treatment, the patient has consented to  Procedure(s): APPENDECTOMY LAPAROSCOPIC (N/A) as a surgical intervention.  The patient's history has been reviewed, patient examined, no change in status, stable for surgery.  I have reviewed the patient's chart and labs.  Questions were answered to the patient's satisfaction.    Chart reviewed. RLQ TTP  We discussed the etiology and management of acute appendicitis. We discussed operative and nonoperative management.  I recommended operative management along with IV antibiotics.  We discussed laparoscopic appendectomy. We discussed the risk and benefits of surgery including but not limited to bleeding, infection, injury to surrounding structures, need to convert to an open procedure, blood clot formation, post operative abscess or wound infection, staple line complications such as leak or bleeding, hernia formation, post operative ileus, need for additional procedures, anesthesia complications, and the typical postoperative course. I explained that the patient should expect a good improvement in their symptoms.  Greer Pickerel

## 2019-11-09 NOTE — ED Triage Notes (Signed)
abdominal pain on Saturday, woke during the night with vomiting, continued stomach pain, weak and headache.

## 2019-11-09 NOTE — Transfer of Care (Signed)
Immediate Anesthesia Transfer of Care Note  Patient: Jody Parrish  Procedure(s) Performed: LAPAROSCOPIC APPENDECTOMY (N/A Abdomen)  Patient Location: PACU  Anesthesia Type:General  Level of Consciousness: awake, alert  and oriented  Airway & Oxygen Therapy: Patient Spontanous Breathing  Post-op Assessment: Report given to RN and Post -op Vital signs reviewed and stable  Post vital signs: Reviewed and stable  Last Vitals:  Vitals Value Taken Time  BP 111/61 11/09/19 1941  Temp    Pulse 123 11/09/19 1942  Resp 16 11/09/19 1942  SpO2 100 % 11/09/19 1942  Vitals shown include unvalidated device data.  Last Pain:  Vitals:   11/09/19 1545  TempSrc:   PainSc: 0-No pain         Complications: No apparent anesthesia complications

## 2019-11-09 NOTE — Discharge Instructions (Addendum)
[facility] ° °Please arrive at least 30 min before your appointment to complete your check in paperwork.  If you are unable to arrive 30 min prior to your appointment time we may have to cancel or reschedule you. °LAPAROSCOPIC SURGERY: POST OP INSTRUCTIONS °Always review your discharge instruction sheet given to you by the facility where your surgery was performed. °IF YOU HAVE DISABILITY OR FAMILY LEAVE FORMS, YOU MUST BRING THEM TO THE OFFICE FOR PROCESSING.   °DO NOT GIVE THEM TO YOUR DOCTOR. ° °PAIN CONTROL ° °1. First take acetaminophen (Tylenol) AND/or ibuprofen (Advil) to control your pain after surgery.  Follow directions on package.  Taking acetaminophen (Tylenol) and/or ibuprofen (Advil) regularly after surgery will help to control your pain and lower the amount of prescription pain medication you may need.  You should not take more than 4,000 mg (4 grams) of acetaminophen (Tylenol) in 24 hours.  You should not take ibuprofen (Advil), aleve, motrin, naprosyn or other NSAIDS if you have a history of stomach ulcers or chronic kidney disease.  °2. A prescription for pain medication may be given to you upon discharge.  Take your pain medication as prescribed, if you still have uncontrolled pain after taking acetaminophen (Tylenol) or ibuprofen (Advil). °3. Use ice packs to help control pain. °4. If you need a refill on your pain medication, please contact your pharmacy.  They will contact our office to request authorization. Prescriptions will not be filled after 5pm or on week-ends. ° °HOME MEDICATIONS °5. Take your usually prescribed medications unless otherwise directed. ° °DIET °6. You should follow a light diet the first few days after arrival home.  Be sure to include lots of fluids daily. Avoid fatty, fried foods.  ° °CONSTIPATION °7. It is common to experience some constipation after surgery and if you are taking pain medication.  Increasing fluid intake and taking a stool  softener (such as Colace) will usually help or prevent this problem from occurring.  A mild laxative (Milk of Magnesia or Miralax) should be taken according to package instructions if there are no bowel movements after 48 hours. ° °WOUND/INCISION CARE °8. Most patients will experience some swelling and bruising in the area of the incisions.  Ice packs will help.  Swelling and bruising can take several days to resolve.  °9. Unless discharge instructions indicate otherwise, follow guidelines below  °a. STERI-STRIPS - you may remove your outer bandages 48 hours after surgery, and you may shower at that time.  You have steri-strips (small skin tapes) in place directly over the incision.  These strips should be left on the skin for 7-10 days.   °b. DERMABOND/SKIN GLUE - you may shower in 24 hours.  The glue will flake off over the next 2-3 weeks. °10. Any sutures or staples will be removed at the office during your follow-up visit. ° °ACTIVITIES °11. You may resume regular (light) daily activities beginning the next day--such as daily self-care, walking, climbing stairs--gradually increasing activities as tolerated.  You may have sexual intercourse when it is comfortable.  Refrain from any heavy lifting or straining until approved by your doctor. °a. You may drive when you are no longer taking prescription pain medication, you can comfortably wear a seatbelt, and you can safely maneuver your car and apply brakes. ° °FOLLOW-UP °12. You should see your doctor in the office for a follow-up appointment approximately 2-3 weeks after your surgery.  You should have been given your post-op/follow-up appointment when   your surgery was scheduled.  If you did not receive a post-op/follow-up appointment, make sure that you call for this appointment within a day or two after you arrive home to insure a convenient appointment time.  OTHER INSTRUCTIONS  WHEN TO CALL YOUR DOCTOR: 1. Fever over 101.0 2. Inability to  urinate 3. Continued bleeding from incision. 4. Increased pain, redness, or drainage from the incision. 5. Increasing abdominal pain  The clinic staff is available to answer your questions during regular business hours.  Please dont hesitate to call and ask to speak to one of the nurses for clinical concerns.  If you have a medical emergency, go to the nearest emergency room or call 911.  A surgeon from [facility] is always on call at the hospital. [address], [place], [place], [place]  [address] ? [address], [place], [place]   [address] [phone] ? [phone] ? FAX [phone]     Managing Your Pain After Surgery Without Opioids    Thank you for participating in our program to help patients manage their pain after surgery without opioids. This is part of our effort to provide you with the best care possible, without exposing you or your family to the risk that opioids pose.  What pain can I expect after surgery? You can expect to have some pain after surgery. This is normal. The pain is typically worse the day after surgery, and quickly begins to get better. Many studies have found that many patients are able to manage their pain after surgery with Over-the-Counter (OTC) medications such as Tylenol and Motrin. If you have a condition that does not allow you to take Tylenol or Motrin, notify your surgical team.  How will I manage my pain? The best strategy for controlling your pain after surgery is around the clock pain control with Tylenol (acetaminophen) and Motrin (ibuprofen or Advil). Alternating these medications with each other allows you to maximize your pain control. In addition to Tylenol and Motrin, you can use heating pads or ice packs on your incisions to help reduce your pain.  How will I alternate your regular strength over-the-counter pain medication? You will take a dose of pain medication every three hours. ; Start by taking 650  mg of Tylenol (2 pills of 325 mg) ; 3 hours later take 600 mg of Motrin (3 pills of 200 mg) ; 3 hours after taking the Motrin take 650 mg of Tylenol ; 3 hours after that take 600 mg of Motrin.   - 1 -  See example - if your first dose of Tylenol is at 12:00 PM   12:00 PM Tylenol 650 mg (2 pills of 325 mg)  3:00 PM Motrin 600 mg (3 pills of 200 mg)  6:00 PM Tylenol 650 mg (2 pills of 325 mg)  9:00 PM Motrin 600 mg (3 pills of 200 mg)  Continue alternating every 3 hours   We recommend that you follow this schedule around-the-clock for at least 3 days after surgery, or until you feel that it is no longer needed. Use the table on the last page of this handout to keep track of the medications you are taking. Important: Do not take more than 3000mg  of Tylenol or 1800mg  of Motrin in a 24-hour period. Do not take ibuprofen/Motrin if you have a history of bleeding stomach ulcers, severe kidney disease, &/or actively taking a blood thinner  What if I still have pain? If you have pain that  that is not controlled with the over-the-counter pain medications (Tylenol and Motrin or Advil) you might have what we call "breakthrough" pain. You will receive a prescription for a small amount of an opioid pain medication such as Oxycodone, Tramadol, or Tylenol with Codeine. Use these opioid pills in the first 24 hours after surgery if you have breakthrough pain. Do not take more than 1 pill every 4-6 hours.  If you still have uncontrolled pain after using all opioid pills, don't hesitate to call our staff using the number provided. We will help make sure you are managing your pain in the best way possible, and if necessary, we can provide a prescription for additional pain medication.   Day 1    Time  Name of Medication Number of pills taken  Amount of Acetaminophen  Pain Level   Comments  AM PM       AM PM       AM PM       AM PM       AM PM       AM PM       AM PM       AM PM       Total Daily  amount of Acetaminophen Do not take more than  3,000 mg per day      Day 2    Time  Name of Medication Number of pills taken  Amount of Acetaminophen  Pain Level   Comments  AM PM       AM PM       AM PM       AM PM       AM PM       AM PM       AM PM       AM PM       Total Daily amount of Acetaminophen Do not take more than  3,000 mg per day      Day 3    Time  Name of Medication Number of pills taken  Amount of Acetaminophen  Pain Level   Comments  AM PM       AM PM       AM PM       AM PM          AM PM       AM PM       AM PM       AM PM       Total Daily amount of Acetaminophen Do not take more than  3,000 mg per day      Day 4    Time  Name of Medication Number of pills taken  Amount of Acetaminophen  Pain Level   Comments  AM PM       AM PM       AM PM       AM PM       AM PM       AM PM       AM PM       AM PM       Total Daily amount of Acetaminophen Do not take more than  3,000 mg per day      Day 5    Time  Name of Medication Number of pills taken  Amount of Acetaminophen  Pain Level   Comments  AM PM       AM PM         AM PM       AM PM       AM PM       AM PM       AM PM       AM PM       Total Daily amount of Acetaminophen Do not take more than  3,000 mg per day       Day 6    Time  Name of Medication Number of pills taken  Amount of Acetaminophen  Pain Level  Comments  AM PM       AM PM       AM PM       AM PM       AM PM       AM PM       AM PM       AM PM       Total Daily amount of Acetaminophen Do not take more than  3,000 mg per day      Day 7    Time  Name of Medication Number of pills taken  Amount of Acetaminophen  Pain Level   Comments  AM PM       AM PM       AM PM       AM PM       AM PM       AM PM       AM PM       AM PM       Total Daily amount of Acetaminophen Do not take more than  3,000 mg per day        For additional information about how and where to  safely dispose of unused opioid medications - https://www.morepowerfulnc.org  Disclaimer: This document contains information and/or instructional materials adapted from [facility] for the typical patient with your condition. It does not replace medical advice from your health care provider because your experience may differ from that of the typical patient. Talk to your health care provider if you have any questions about this document, your condition or your treatment plan. Adapted from [facility]  

## 2019-11-09 NOTE — ED Triage Notes (Signed)
Patient sent from [facility] for RLQ pain since Saturday. Nausea and vomiting with same. Here to be ruled our for appendicitis

## 2019-11-09 NOTE — Anesthesia Procedure Notes (Signed)
Procedure Name: Intubation Date/Time: 11/09/2019 6:22 PM Performed by: Babs Bertin, CRNA Pre-anesthesia Checklist: Patient identified, Emergency Drugs available, Suction available and Patient being monitored Patient Re-evaluated:Patient Re-evaluated prior to induction Oxygen Delivery Method: Circle System Utilized Preoxygenation: Pre-oxygenation with 100% oxygen Induction Type: IV induction Ventilation: Mask ventilation without difficulty Laryngoscope Size: Mac and 3 Grade View: Grade I Tube type: Oral Tube size: 7.0 mm Number of attempts: 1 Airway Equipment and Method: Stylet and Oral airway Placement Confirmation: ETT inserted through vocal cords under direct vision,  positive ETCO2 and breath sounds checked- equal and bilateral Secured at: 21 cm Tube secured with: Tape Dental Injury: Teeth and Oropharynx as per pre-operative assessment

## 2019-11-09 NOTE — ED Provider Notes (Signed)
[facility] EMERGENCY DEPARTMENT Provider Note   CSN: [id number] Arrival date & time: 11/09/19  1017     History   Chief Complaint Chief Complaint  Patient presents with   Abdominal Pain    HPI Jody Parrish is a 43 y.o. female.     Jody Parrish is a 43 y.o. female with a history of migraine, GERD, short-bowel syndrome, anxiety, and anemia, who was sent over from urgent care for evaluation of right lower quadrant pain.  Patient reports that the pain began 2 days ago on Saturday evening and was associated with multiple episodes of nausea and vomiting over the weekend, no hematemesis.  Patient denies any associated diarrhea, constipation or blood in the stools.  She reports pain is present across the lower abdomen but is much worse in the right lower quadrant.  Reports she felt every bump in the road in this area on the drive over to [facility] this morning.  Denies history of similar pain previously, 2 previous C-sections but no other history of abdominal surgeries.  Has not taken anything for her symptoms prior to arrival reports that due to increasing pain yesterday she laid around the house most of the day.  Sent from urgent care with concerns for possible appendicitis.     Past Medical History:  Diagnosis Date   Anemia    Anxiety    GERD (gastroesophageal reflux disease)    Headache(784.0)    Migraine without aura    Seasonal allergies    Short bowel syndrome     Patient Active Problem List   Diagnosis Date Noted   Fibroids, subserous 06/12/2018   Enlarged uterus 06/03/2018   Chest tightness 01/11/2016   HEADACHE 11/06/2007    Past Surgical History:  Procedure Laterality Date   CARPAL TUNNEL RELEASE Right 2002   CESAREAN SECTION  x 2   PLANTAR FASCIA SURGERY Right    WISDOM TOOTH EXTRACTION       OB History    Gravida  4   Para  2   Term  2   Preterm      AB  2   Living  2     SAB      TAB  2   Ectopic     Multiple      Live Births  2            Home Medications    Prior to Admission medications   Medication Sig Start Date End Date Taking? Authorizing Provider  EPIPEN 2-PAK 0.3 MG/0.3ML SOAJ injection  06/02/14   [provider]  levocetirizine (XYZAL) 5 MG tablet TAKE 1 TABLET (5 MG TOTAL) BY MOUTH EVERY EVENING. 05/14/17   Carollee Herter, Alferd Apa, DO  NON FORMULARY t3 t4 thyroid medicine    [provider]  NON FORMULARY LDN    [provider]    Family History Family History  Problem Relation Age of Onset   Hypertension Mother    Osteoporosis Mother    Atrial fibrillation Mother    Melanoma Father    Hypertension Paternal Grandmother    Hypertension Maternal Grandfather    Alzheimer's disease Maternal Grandmother     Social History Social History   Tobacco Use   Smoking status: Former Smoker    Years: 10.00    Types: Cigarettes    Quit date: 05/10/1994    Years since quitting: 25.5   Smokeless tobacco: Never Used  Substance Use Topics  Alcohol use: Yes    Alcohol/week: 3.0 standard drinks    Types: 3 Standard drinks or equivalent per week    Comment: occasionally   Drug use: No     Allergies   Amoxicillin, Other, Sulfa antibiotics, and Penicillins   Review of Systems Review of Systems  Constitutional: Negative for chills and fever.  HENT: Negative.   Eyes: Negative for visual disturbance.  Respiratory: Negative for cough and shortness of breath.   Cardiovascular: Negative for chest pain.  Gastrointestinal: Positive for abdominal pain, nausea and vomiting. Negative for diarrhea.  Genitourinary: Negative for dysuria and frequency.  Musculoskeletal: Negative for arthralgias and myalgias.  Skin: Negative for color change and rash.  Neurological: Negative for dizziness, syncope and light-headedness.     Physical Exam Updated Vital Signs BP 112/75 (BP Location: Left Arm)    Pulse (!) 104    Temp 98.4 F (36.9 C)  (Oral)    Resp 16    LMP 10/29/2019    SpO2 99%   Physical Exam Vitals signs and nursing note reviewed.  Constitutional:      General: She is not in acute distress.    Appearance: She is well-developed and normal weight. She is not diaphoretic.     Comments: Patient appears uncomfortable but is in no acute distress  HENT:     Head: Normocephalic and atraumatic.     Mouth/Throat:     Mouth: Mucous membranes are moist.     Pharynx: Oropharynx is clear.  Eyes:     General:        Right eye: No discharge.        Left eye: No discharge.     Pupils: Pupils are equal, round, and reactive to light.  Neck:     Musculoskeletal: Neck supple.  Cardiovascular:     Rate and Rhythm: Normal rate and regular rhythm.     Heart sounds: Normal heart sounds.  Pulmonary:     Effort: Pulmonary effort is normal. No respiratory distress.     Breath sounds: Normal breath sounds. No wheezing or rales.     Comments: Respirations equal and unlabored, patient able to speak in full sentences, lungs clear to auscultation bilaterally Abdominal:     General: Bowel sounds are normal. There is no distension.     Palpations: Abdomen is soft. There is no mass.     Tenderness: There is abdominal tenderness in the right lower quadrant. There is guarding. Positive signs include McBurney's sign.     Comments: Abdomen is soft, nondistended, bowel sounds present throughout, patient is focally tender in the right lower quadrant with guarding and rebound tenderness, positive tenderness at McBurney's point.  No CVA tenderness.  Musculoskeletal:        General: No deformity.  Skin:    General: Skin is warm and dry.     Capillary Refill: Capillary refill takes less than 2 seconds.  Neurological:     Mental Status: She is alert.     Coordination: Coordination normal.     Comments: Speech is clear, able to follow commands Moves extremities without ataxia, coordination intact  Psychiatric:        Mood and Affect: Mood  normal.        Behavior: Behavior normal.      ED Treatments / Results  Labs (all labs ordered are listed, but only abnormal results are displayed) Labs Reviewed  COMPREHENSIVE METABOLIC PANEL - Abnormal; Notable for the following components:  Result Value   Glucose, Bld 125 (*)    All other components within normal limits  CBC - Abnormal; Notable for the following components:   WBC 10.8 (*)    All other components within normal limits  SARS CORONAVIRUS 2 BY RT PCR (HOSPITAL ORDER, [facility])  LIPASE, BLOOD  URINALYSIS, ROUTINE W REFLEX MICROSCOPIC  I-STAT BETA HCG BLOOD, ED ([facility], [facility], [facility] ONLY)    EKG None  Radiology Ct Abdomen Pelvis W Contrast  Result Date: 11/09/2019 CLINICAL DATA:  Right lower quadrant abdominal pain for the past 2 days. Two previous C-sections. Clinical concern for appendicitis. EXAM: CT ABDOMEN AND PELVIS WITH CONTRAST TECHNIQUE: Multidetector CT imaging of the abdomen and pelvis was performed using the standard protocol following bolus administration of intravenous contrast. CONTRAST:  124mL OMNIPAQUE IOHEXOL 300 MG/ML  SOLN COMPARISON:  None. FINDINGS: Lower chest: Unremarkable. Hepatobiliary: No focal liver abnormality is seen. No gallstones, gallbladder wall thickening, or biliary dilatation. Pancreas: Unremarkable. No pancreatic ductal dilatation or surrounding inflammatory changes. Spleen: Normal in size without focal abnormality. Adrenals/Urinary Tract: Adrenal glands are unremarkable. Kidneys are normal, without renal calculi, focal lesion, or hydronephrosis. Bladder is unremarkable. Stomach/Bowel: Dilated appendix filled with fluid and with mild diffuse wall thickening and enhancement, measuring 14.3 mm in maximum diameter. There are 3 appendicoliths in the distal appendix, the largest measuring 8 mm in maximum diameter. Periappendiceal soft tissue stranding is noted with no visible abscess. The appendix is rising from the  cecum in the upper pelvis on the right in the tip of the appendix is to the right of midline, medial to the distal right common iliac artery. Unremarkable stomach, small bowel and colon. Vascular/Lymphatic: No significant vascular findings are present. No enlarged abdominal or pelvic lymph nodes. Reproductive: Uterus and bilateral adnexa are unremarkable. Other: Small amount of free peritoneal fluid in the pelvic cul-de-sac, within normal limits of physiological fluid. No free peritoneal air seen. Tiny umbilical hernia containing fat. Musculoskeletal: Unremarkable bones. IMPRESSION: Acute appendicitis without abscess. Electronically Signed   By: Claudie Revering M.D.   On: 11/09/2019 13:43    Procedures Procedures (including critical care time)  Medications Ordered in ED Medications  sodium chloride flush (NS) 0.9 % injection 3 mL (3 mLs Intravenous Not Given 11/09/19 1143)  sodium chloride 0.9 % bolus 1,000 mL (1,000 mLs Intravenous New Bag/Given 11/09/19 1229)  ondansetron (ZOFRAN) injection 4 mg (4 mg Intravenous Given 11/09/19 1227)  morphine 4 MG/ML injection 4 mg (4 mg Intravenous Given 11/09/19 1228)     Initial Impression / Assessment and Plan / ED Course  I have reviewed the triage vital signs and the nursing notes.  Pertinent labs & imaging results that were available during my care of the patient were reviewed by me and considered in my medical decision making (see chart for details).  43 year old female sent from urgent care for evaluation of right lower quadrant pain that started on Saturday with associated nausea and vomiting.  On arrival patient appears uncomfortable but is in no acute distress.  Mildly tachycardic but this resolved with pain medication and IV fluids, afebrile.  Focally tender in the right lower quadrant with guarding.  Presentation very concerning for appendicitis, will plan for abdominal labs and CT abdomen pelvis.  Would also consider diverticulitis, ovarian torsion or  gastroenteritis, but feel appendicitis is most likely.  Patient given IV fluids, pain and nausea medication.  Lab work shows mild leukocytosis of 10.8, normal hemoglobin, no significant electrolyte  derangements, normal renal and liver function and normal lipase.  CT scan consistent with acute appendicitis without abscess or perforation.  Given patient's antibiotic allergies discussed with pharmacy who recommends Rocephin and Flagyl for presurgery antibiotics.  Patient is in agreement.  Additional dose of pain medication given.  Obtained clearance from [facility] for rapid Covid test which is in process.  Case discussed with PA Margie Billet with general surgery who will see and admit the patient for appendectomy.  Final Clinical Impressions(s) / ED Diagnoses   Final diagnoses:  Appendicitis with peritonitis    ED Discharge Orders    None       Janet Berlin 11/09/19 1508    Virgel Manifold, MD 11/10/19 [phone]

## 2019-11-09 NOTE — ED Notes (Signed)
Labeled specimen in lab °

## 2019-11-10 ENCOUNTER — Encounter (HOSPITAL_COMMUNITY): Payer: Self-pay | Admitting: General Practice

## 2019-11-10 MED ORDER — POLYETHYLENE GLYCOL 3350 17 G PO PACK: 17.0000 g | PACK | Freq: Every day | ORAL | 0 refills | Status: DC | PRN

## 2019-11-10 MED ORDER — METRONIDAZOLE IN NACL 5-0.79 MG/ML-% IV SOLN
500.0000 mg | Freq: Three times a day (TID) | INTRAVENOUS | Status: DC
  Administered 2019-11-10 (×2): 500 mg via INTRAVENOUS
  Filled 2019-11-10 (×2): qty 100

## 2019-11-10 MED ORDER — ACETAMINOPHEN 500 MG PO TABS
1000.0000 mg | ORAL_TABLET | Freq: Three times a day (TID) | ORAL | Status: DC
  Administered 2019-11-10 (×2): 1000 mg via ORAL
  Filled 2019-11-10 (×2): qty 2

## 2019-11-10 MED ORDER — ACETAMINOPHEN 500 MG PO TABS: 1000.0000 mg | ORAL_TABLET | Freq: Three times a day (TID) | ORAL | 0 refills | Status: DC | PRN

## 2019-11-10 MED ORDER — CIPROFLOXACIN HCL 500 MG PO TABS: 500.0000 mg | ORAL_TABLET | Freq: Two times a day (BID) | ORAL | 0 refills | Status: AC

## 2019-11-10 MED ORDER — KETOROLAC TROMETHAMINE 30 MG/ML IJ SOLN
30.0000 mg | Freq: Three times a day (TID) | INTRAMUSCULAR | Status: DC
  Administered 2019-11-10 (×2): 30 mg via INTRAVENOUS
  Filled 2019-11-10 (×2): qty 1

## 2019-11-10 MED ORDER — METRONIDAZOLE 500 MG PO TABS: 500.0000 mg | ORAL_TABLET | Freq: Three times a day (TID) | ORAL | 0 refills | Status: AC

## 2019-11-10 MED ORDER — OXYCODONE HCL 5 MG PO TABS: 5.0000 mg | ORAL_TABLET | Freq: Four times a day (QID) | ORAL | 0 refills | Status: DC | PRN

## 2019-11-10 NOTE — Discharge Summary (Signed)
[facility] Discharge Summary   Patient ID: Jody Parrish MRN: [id number] DOB/AGE: [birth year] 43 y.o.  Admit date: 11/09/2019 Discharge date: 11/10/2019  Admitting Diagnosis: Acute appendicitis  Discharge Diagnosis Feculent perforated appendicitis  Consultants None  Imaging: Ct Abdomen Pelvis W Contrast  Result Date: 11/09/2019 CLINICAL DATA:  Right lower quadrant abdominal pain for the past 2 days. Two previous C-sections. Clinical concern for appendicitis. EXAM: CT ABDOMEN AND PELVIS WITH CONTRAST TECHNIQUE: Multidetector CT imaging of the abdomen and pelvis was performed using the standard protocol following bolus administration of intravenous contrast. CONTRAST:  117mL OMNIPAQUE IOHEXOL 300 MG/ML  SOLN COMPARISON:  None. FINDINGS: Lower chest: Unremarkable. Hepatobiliary: No focal liver abnormality is seen. No gallstones, gallbladder wall thickening, or biliary dilatation. Pancreas: Unremarkable. No pancreatic ductal dilatation or surrounding inflammatory changes. Spleen: Normal in size without focal abnormality. Adrenals/Urinary Tract: Adrenal glands are unremarkable. Kidneys are normal, without renal calculi, focal lesion, or hydronephrosis. Bladder is unremarkable. Stomach/Bowel: Dilated appendix filled with fluid and with mild diffuse wall thickening and enhancement, measuring 14.3 mm in maximum diameter. There are 3 appendicoliths in the distal appendix, the largest measuring 8 mm in maximum diameter. Periappendiceal soft tissue stranding is noted with no visible abscess. The appendix is rising from the cecum in the upper pelvis on the right in the tip of the appendix is to the right of midline, medial to the distal right common iliac artery. Unremarkable stomach, small bowel and colon. Vascular/Lymphatic: No significant vascular findings are present. No enlarged abdominal or pelvic lymph nodes. Reproductive: Uterus and bilateral adnexa are unremarkable. Other: Small  amount of free peritoneal fluid in the pelvic cul-de-sac, within normal limits of physiological fluid. No free peritoneal air seen. Tiny umbilical hernia containing fat. Musculoskeletal: Unremarkable bones. IMPRESSION: Acute appendicitis without abscess. Electronically Signed   By: Claudie Revering M.D.   On: 11/09/2019 13:43    Procedures Dr. Redmond Pulling (11/09/19) - Laparoscopic Appendectomy  Hospital Course:  Jody Parrish is a 43yo female who presented to [facility] 11/9 with 2-3 days of worsening abdominal pain.  Workup showed acute appendicitis and mild leukocytosis.  Patient was admitted and underwent procedure listed above. Intraoperatively she was found to have feculent perforated acute appendicitis. Tolerated procedure well and was transferred to the floor.  Diet was advanced as tolerated.  On POD1, the patient was voiding well, tolerating diet, ambulating well, pain well controlled, vital signs stable, incisions c/d/i and felt stable for discharge home.  Patient will follow up as below and knows to call with questions or concerns. She will be discharge home with 7 days of cipro/flagyl.    I have personally reviewed the patients medication history on the [place] controlled substance database.    Allergies as of 11/10/2019      Reactions   Amoxicillin Hives   Clarithromycin Hives   Other    Food intolerances -- Auto immune responses to foods (Gluten, Dairy, Corn, Nuts, Citrus, etc)   Penicillins Rash   Did it involve swelling of the face/tongue/throat, SOB, or low BP? Yes Did it involve sudden or severe rash/hives, skin peeling, or any reaction on the inside of your mouth or nose? Yes Did you need to seek medical attention at a hospital or doctor's office? Yes When did it last happen?Pt was 43 years old If all above answers are "NO", may proceed with cephalosporin use.   Sulfa Antibiotics Nausea Only      Medication List    TAKE these medications  acetaminophen 500 MG tablet Commonly  known as: TYLENOL Take 2 tablets (1,000 mg total) by mouth every 8 (eight) hours as needed for mild pain.   ciprofloxacin 500 MG tablet Commonly known as: Cipro Take 1 tablet (500 mg total) by mouth 2 (two) times daily for 7 days.   metroNIDAZOLE 500 MG tablet Commonly known as: Flagyl Take 1 tablet (500 mg total) by mouth 3 (three) times daily for 7 days.   NON FORMULARY Take 1-2 capsules by mouth See admin instructions. t3 t4 thyroid medicine: Takes 1 capsule in the afternoon and 2 capsules in the middle of the night.   NON FORMULARY Take 1 capsule by mouth every evening. LDN medicine(low-dose naltrexone)   oxyCODONE 5 MG immediate release tablet Commonly known as: Oxy IR/ROXICODONE Take 1 tablet (5 mg total) by mouth every 6 (six) hours as needed for severe pain.   polyethylene glycol 17 g packet Commonly known as: MIRALAX / GLYCOLAX Take 17 g by mouth daily as needed for mild constipation.        Follow-up [facility], [place]. Call.   Specialty: General Surgery Why: We are working on your appointment, call to confirm. Please arrive 30 minutes prior to your appointment to check in and fill out paperwork. Bring photo ID and insurance information. Contact information: [address] [place] [phone]          Signed: Wellington Hampshire, [facility] 11/10/2019, 9:21 AM Please see Amion for pager number during day hours 7:00am-4:30pm

## 2019-11-10 NOTE — Plan of Care (Signed)
  Problem: Clinical Measurements: Goal: Will remain free from infection Outcome: Progressing   Problem: Coping: Goal: Level of anxiety will decrease Outcome: Progressing   Problem: Pain Managment: Goal: General experience of comfort will improve Outcome: Progressing   Problem: Safety: Goal: Ability to remain free from injury will improve Outcome: Progressing   Problem: Skin Integrity: Goal: Risk for impaired skin integrity will decrease Outcome: Progressing   

## 2019-11-10 NOTE — Progress Notes (Signed)
[facility] Progress Note  1 Day Post-Op  Subjective: CC-  Feeling much better than prior to surgery. Abdomen a little sore but she denies any pain. Denies n/v. Tolerating liquids. No flatus or BM. Ambulating to restroom without difficulty.  Objective: Vital signs in last 24 hours: Temp:  [97.7 F (36.5 C)-98.7 F (37.1 C)] 98.7 F (37.1 C) (11/10 0337) Pulse Rate:  [83-121] 83 (11/10 0337) Resp:  [15-20] 18 (11/10 0337) BP: (101-125)/(61-88) 107/63 (11/10 0337) SpO2:  [95 %-100 %] 99 % (11/10 0337) Last BM Date: 11/09/19  Intake/Output from previous day: 11/09 0701 - 11/10 0700 In: 4345.3 [P.O.:240; I.V.:2479.2; IV Piggyback:1626.1] Out: 10 [Blood:10] Intake/Output this shift: No intake/output data recorded.  PE: Gen:  Alert, NAD, pleasant HEENT: EOM's intact, pupils equal and round Pulm:  Rate and effort normal Abd: Soft, NT/ND, few BS heard, lap incisions C/D/I Psych: A&Ox3  Skin: no rashes noted, warm and dry  Lab Results:  Recent Labs    11/09/19 1123  WBC 10.8*  HGB 14.0  HCT 42.0  PLT 168   BMET Recent Labs    11/09/19 1123  NA 140  K 3.8  CL 107  CO2 24  GLUCOSE 125*  BUN 11  CREATININE 0.84  CALCIUM 9.0   PT/INR No results for input(s): LABPROT, INR in the last 72 hours. CMP     Component Value Date/Time   NA 140 11/09/2019 1123   K 3.8 11/09/2019 1123   CL 107 11/09/2019 1123   CO2 24 11/09/2019 1123   GLUCOSE 125 (H) 11/09/2019 1123   BUN 11 11/09/2019 1123   CREATININE 0.84 11/09/2019 1123   CALCIUM 9.0 11/09/2019 1123   PROT 6.9 11/09/2019 1123   ALBUMIN 4.0 11/09/2019 1123   AST 18 11/09/2019 1123   ALT 17 11/09/2019 1123   ALKPHOS 45 11/09/2019 1123   BILITOT 0.7 11/09/2019 1123   GFRNONAA >60 11/09/2019 1123   GFRAA >60 11/09/2019 1123   Lipase     Component Value Date/Time   LIPASE 25 11/09/2019 1123       Studies/Results: Ct Abdomen Pelvis W Contrast  Result Date: 11/09/2019 CLINICAL DATA:  Right  lower quadrant abdominal pain for the past 2 days. Two previous C-sections. Clinical concern for appendicitis. EXAM: CT ABDOMEN AND PELVIS WITH CONTRAST TECHNIQUE: Multidetector CT imaging of the abdomen and pelvis was performed using the standard protocol following bolus administration of intravenous contrast. CONTRAST:  131mL OMNIPAQUE IOHEXOL 300 MG/ML  SOLN COMPARISON:  None. FINDINGS: Lower chest: Unremarkable. Hepatobiliary: No focal liver abnormality is seen. No gallstones, gallbladder wall thickening, or biliary dilatation. Pancreas: Unremarkable. No pancreatic ductal dilatation or surrounding inflammatory changes. Spleen: Normal in size without focal abnormality. Adrenals/Urinary Tract: Adrenal glands are unremarkable. Kidneys are normal, without renal calculi, focal lesion, or hydronephrosis. Bladder is unremarkable. Stomach/Bowel: Dilated appendix filled with fluid and with mild diffuse wall thickening and enhancement, measuring 14.3 mm in maximum diameter. There are 3 appendicoliths in the distal appendix, the largest measuring 8 mm in maximum diameter. Periappendiceal soft tissue stranding is noted with no visible abscess. The appendix is rising from the cecum in the upper pelvis on the right in the tip of the appendix is to the right of midline, medial to the distal right common iliac artery. Unremarkable stomach, small bowel and colon. Vascular/Lymphatic: No significant vascular findings are present. No enlarged abdominal or pelvic lymph nodes. Reproductive: Uterus and bilateral adnexa are unremarkable. Other: Small amount of free peritoneal fluid in  the pelvic cul-de-sac, within normal limits of physiological fluid. No free peritoneal air seen. Tiny umbilical hernia containing fat. Musculoskeletal: Unremarkable bones. IMPRESSION: Acute appendicitis without abscess. Electronically Signed   By: Claudie Revering M.D.   On: 11/09/2019 13:43    Anti-infectives: Anti-infectives (From admission, onward)    Start     Dose/Rate Route Frequency Ordered Stop   11/10/19 0600  metroNIDAZOLE (FLAGYL) IVPB 500 mg     500 mg 100 mL/hr over 60 Minutes Intravenous Every 8 hours 11/10/19 0514     11/09/19 2100  ciprofloxacin (CIPRO) IVPB 400 mg     400 mg 200 mL/hr over 60 Minutes Intravenous Every 12 hours 11/09/19 1945     11/09/19 1430  cefTRIAXone (ROCEPHIN) 2 g in sodium chloride 0.9 % 100 mL IVPB     2 g 200 mL/hr over 30 Minutes Intravenous  Once 11/09/19 1421 11/09/19 1545   11/09/19 1430  metroNIDAZOLE (FLAGYL) IVPB 500 mg     500 mg 100 mL/hr over 60 Minutes Intravenous  Once 11/09/19 1421 11/09/19 1719       Assessment/Plan Multiple allergies and food intolerances - takes naltrexone  Feculent perforated acute appendicitis S/p laparoscopic appendectomy 11/9 Dr. Redmond Pulling - POD#1 - Advance to regular diet. Mobilize. Continue IV antibiotics. Plan to discharge home with oral antibiotics to complete a 7 day course.  ID - rocephin 11/9 x1, flagyl 11/9>>, cipro 11/9>> VTE - SCDs, lovenox FEN - IVF, reg diet Foley - none Follow up - [facility]   LOS: 0 days    Wellington Hampshire, [facility] 11/10/2019, 8:07 AM Please see Amion for pager number during day hours 7:00am-4:30pm

## 2019-11-10 NOTE — Plan of Care (Addendum)
Provided discharge education, pt stated understanding and denied any questions. All belongings returned. Pt discharged from unit.    Problem: Education: Goal: Knowledge of General Education information will improve Description: Including pain rating scale, medication(s)/side effects and non-pharmacologic comfort measures Outcome: Completed/Met   Problem: Health Behavior/Discharge Planning: Goal: Ability to manage health-related needs will improve Outcome: Completed/Met   Problem: Clinical Measurements: Goal: Ability to maintain clinical measurements within normal limits will improve Outcome: Completed/Met Goal: Will remain free from infection 11/10/2019 1350 by Shelvia Fojtik, Curley Spice, RN Outcome: Completed/Met 11/10/2019 1343 by Stevan Born, RN Outcome: Progressing Goal: Diagnostic test results will improve Outcome: Completed/Met Goal: Respiratory complications will improve Outcome: Completed/Met Goal: Cardiovascular complication will be avoided Outcome: Completed/Met   Problem: Activity: Goal: Risk for activity intolerance will decrease Outcome: Completed/Met   Problem: Nutrition: Goal: Adequate nutrition will be maintained Outcome: Completed/Met   Problem: Coping: Goal: Level of anxiety will decrease 11/10/2019 1350 by Stevan Born, RN Outcome: Completed/Met 11/10/2019 1343 by Stevan Born, RN Outcome: Progressing   Problem: Elimination: Goal: Will not experience complications related to bowel motility Outcome: Completed/Met Goal: Will not experience complications related to urinary retention Outcome: Completed/Met   Problem: Pain Managment: Goal: General experience of comfort will improve 11/10/2019 1350 by Stevan Born, RN Outcome: Completed/Met 11/10/2019 1343 by Stevan Born, RN Outcome: Progressing   Problem: Safety: Goal: Ability to remain free from injury will improve 11/10/2019 1350 by Stevan Born, RN Outcome:  Completed/Met 11/10/2019 1343 by Stevan Born, RN Outcome: Progressing   Problem: Skin Integrity: Goal: Risk for impaired skin integrity will decrease 11/10/2019 1350 by Stevan Born, RN Outcome: Completed/Met 11/10/2019 1343 by Stevan Born, RN Outcome: Progressing

## 2019-11-11 LAB — SURGICAL PATHOLOGY

## 2019-11-13 NOTE — Anesthesia Postprocedure Evaluation (Signed)
Anesthesia Post Note  Patient: Jody Parrish  Procedure(s) Performed: LAPAROSCOPIC APPENDECTOMY (N/A Abdomen)     Patient location during evaluation: PACU Anesthesia Type: General Level of consciousness: awake and alert Pain management: pain level controlled Vital Signs Assessment: post-procedure vital signs reviewed and stable Respiratory status: spontaneous breathing, nonlabored ventilation, respiratory function stable and patient connected to nasal cannula oxygen Cardiovascular status: blood pressure returned to baseline and stable Postop Assessment: no apparent nausea or vomiting Anesthetic complications: no    Last Vitals:  Vitals:   11/10/19 0840 11/10/19 1307  BP: 112/73 107/74  Pulse: 77 90  Resp: 18 18  Temp: 36.6 C 36.8 C  SpO2: 100% 100%    Last Pain:  Vitals:   11/10/19 1530  TempSrc:   PainSc: 0-No pain                 Knox Cervi COKER

## 2020-03-16 ENCOUNTER — Encounter: Payer: Self-pay | Admitting: Family Medicine

## 2020-03-17 ENCOUNTER — Other Ambulatory Visit: Payer: Self-pay | Admitting: Family Medicine

## 2020-03-17 MED ORDER — LEVOCETIRIZINE DIHYDROCHLORIDE 5 MG PO TABS: 5.0000 mg | ORAL_TABLET | Freq: Every evening | ORAL | 1 refills | Status: DC

## 2020-03-17 NOTE — Telephone Encounter (Signed)
done

## 2020-09-01 ENCOUNTER — Other Ambulatory Visit: Payer: Self-pay | Admitting: Obstetrics and Gynecology

## 2020-09-01 DIAGNOSIS — Z1231 Encounter for screening mammogram for malignant neoplasm of breast: Secondary | ICD-10-CM

## 2020-09-07 ENCOUNTER — Other Ambulatory Visit: Payer: Self-pay

## 2020-09-07 ENCOUNTER — Ambulatory Visit
Admission: RE | Admit: 2020-09-07 | Discharge: 2020-09-07 | Disposition: A | Discharge status: Home / Self Care | Payer: BC Managed Care – PPO | Source: Ambulatory Visit

## 2020-09-07 DIAGNOSIS — Z1231 Encounter for screening mammogram for malignant neoplasm of breast: Secondary | ICD-10-CM

## 2021-04-03 ENCOUNTER — Encounter: Payer: Self-pay | Admitting: Family Medicine

## 2021-04-04 NOTE — Telephone Encounter (Signed)
We can do that here -- save her specialty copay--- if she wants

## 2021-04-05 ENCOUNTER — Ambulatory Visit: Payer: BC Managed Care – PPO | Admitting: Family

## 2021-04-05 ENCOUNTER — Other Ambulatory Visit: Payer: Self-pay

## 2021-04-05 VITALS — BP 112/78 | HR 75 | Temp 98.3°F | Resp 16 | Ht 64.0 in | Wt 151.0 lb

## 2021-04-05 DIAGNOSIS — H6123 Impacted cerumen, bilateral: Secondary | ICD-10-CM | POA: Diagnosis not present

## 2021-04-05 NOTE — Progress Notes (Signed)
Subjective:   By signing my name below, I, [name], attest that this documentation has been prepared under the direction and in the presence of  Debbrah Alar. 04/05/21    Patient ID: Jody Parrish, female    DOB: Mar 22, 1976, 45 y.o.   MRN: [id number]  No chief complaint on file.   HPI Patient is in today for office visit for right ear pain that started 3 days ago.  She has severe allergies that typically gives her problem around this time. She has had an allergy shot, but no relief. She has even used Peroxide for 3 days which has helped some. She is not able to take any syrup medications due to incidences of rashes and hives. She denies any fever, chills, nausea vomiting, and diarrhea at this time.   Past Medical History:  Diagnosis Date  . Anemia   . Anxiety   . GERD (gastroesophageal reflux disease)   . Headache(784.0)   . Migraine without aura   . Seasonal allergies   . Short bowel syndrome     Past Surgical History:  Procedure Laterality Date  . APPENDECTOMY  11/09/2019  . CARPAL TUNNEL RELEASE Right 2002  . CESAREAN SECTION  x 2  . LAPAROSCOPIC APPENDECTOMY N/A 11/09/2019   Procedure: LAPAROSCOPIC APPENDECTOMY;  Surgeon: Greer Pickerel, MD;  Location: [facility];  Service: General;  Laterality: N/A;  . PLANTAR FASCIA SURGERY Right   . WISDOM TOOTH EXTRACTION      Family History  Problem Relation Age of Onset  . Hypertension Mother   . Osteoporosis Mother   . Atrial fibrillation Mother   . Melanoma Father   . Hypertension Paternal Grandmother   . Hypertension Maternal Grandfather   . Alzheimer's disease Maternal Grandmother     Social History   Socioeconomic History  . Marital status: Married    Spouse name: Not on file  . Number of children: Not on file  . Years of education: Not on file  . Highest education level: Not on file  Occupational History  . Not on file  Tobacco Use  . Smoking status: Former Smoker    Years: 10.00    Types:  Cigarettes    Quit date: 05/10/1994    Years since quitting: 26.9  . Smokeless tobacco: Never Used  Vaping Use  . Vaping Use: Never used  Substance and Sexual Activity  . Alcohol use: Yes    Alcohol/week: 3.0 standard drinks    Types: 3 Standard drinks or equivalent per week    Comment: occasionally  . Drug use: No  . Sexual activity: Yes    Partners: Male    Birth control/protection: Condom  Other Topics Concern  . Not on file  Social History Narrative  . Not on file   Social Determinants of Health   Financial Resource Strain: Not on file  Food Insecurity: Not on file  Transportation Needs: Not on file  Physical Activity: Not on file  Stress: Not on file  Social Connections: Not on file  Intimate Partner Violence: Not on file    Outpatient Medications Prior to Visit  Medication Sig Dispense Refill  . acetaminophen (TYLENOL) 500 MG tablet Take 2 tablets (1,000 mg total) by mouth every 8 (eight) hours as needed for mild pain. 30 tablet 0  . levocetirizine (XYZAL) 5 MG tablet Take 1 tablet (5 mg total) by mouth every evening. 90 tablet 1  . NON FORMULARY Take 1-2 capsules by mouth See admin instructions. t3 t4  thyroid medicine: Takes 1 capsule in the afternoon and 2 capsules in the middle of the night.    . NON FORMULARY Take 1 capsule by mouth every evening. LDN medicine(low-dose naltrexone)    . oxyCODONE (OXY IR/ROXICODONE) 5 MG immediate release tablet Take 1 tablet (5 mg total) by mouth every 6 (six) hours as needed for severe pain. 30 tablet 0  . polyethylene glycol (MIRALAX / GLYCOLAX) 17 g packet Take 17 g by mouth daily as needed for mild constipation. 14 each 0   No facility-administered medications prior to visit.    Allergies  Allergen Reactions  . Amoxicillin Hives  . Clarithromycin Hives  . Other     Food intolerances -- Auto immune responses to foods (Gluten, Dairy, Corn, Nuts, Citrus, etc)  . Penicillins Rash    Did it involve swelling of the  face/tongue/throat, SOB, or low BP? Yes Did it involve sudden or severe rash/hives, skin peeling, or any reaction on the inside of your mouth or nose? Yes Did you need to seek medical attention at a hospital or doctor's office? Yes When did it last happen?Pt was 45 years old If all above answers are "NO", may proceed with cephalosporin use.   . Sulfa Antibiotics Nausea Only    Review of Systems  Constitutional: Negative for fever.  HENT: Positive for ear pain (right).        Objective:    Physical Exam Constitutional:      General: She is not in acute distress.    Appearance: Normal appearance. She is not ill-appearing.  HENT:     Head: Normocephalic and atraumatic.     Right Ear: External ear normal. There is impacted cerumen.     Left Ear: Tympanic membrane and external ear normal. There is impacted cerumen (small amounts ).  Eyes:     Extraocular Movements: Extraocular movements intact.     Pupils: Pupils are equal, round, and reactive to light.  Cardiovascular:     Rate and Rhythm: Normal rate and regular rhythm.     Pulses: Normal pulses.     Heart sounds: Normal heart sounds. No murmur heard.   Pulmonary:     Effort: Pulmonary effort is normal. No respiratory distress.     Breath sounds: Normal breath sounds. No wheezing, rhonchi or rales.  Abdominal:     General: Bowel sounds are normal.     Palpations: Abdomen is soft. There is no mass.     Tenderness: There is no abdominal tenderness.  Lymphadenopathy:     Cervical: Cervical adenopathy (mild right sided) present.  Skin:    General: Skin is warm and dry.  Neurological:     Mental Status: She is alert and oriented to person, place, and time.  Psychiatric:        Behavior: Behavior normal.     There were no vitals taken for this visit. Wt Readings from Last 3 Encounters:  11/10/19 144 lb 2.9 oz (65.4 kg)  06/12/18 144 lb 3.2 oz (65.4 kg)  06/03/18 147 lb (66.7 kg)    Diabetic Foot Exam - Simple    No data filed    Lab Results  Component Value Date   WBC 10.8 (H) 11/09/2019   HGB 14.0 11/09/2019   HCT 42.0 11/09/2019   PLT 168 11/09/2019   GLUCOSE 125 (H) 11/09/2019   ALT 17 11/09/2019   AST 18 11/09/2019   NA 140 11/09/2019   K 3.8 11/09/2019   CL 107 11/09/2019  CREATININE 0.84 11/09/2019   BUN 11 11/09/2019   CO2 24 11/09/2019   TSH 1.280 06/03/2018    Lab Results  Component Value Date   TSH 1.280 06/03/2018   Lab Results  Component Value Date   WBC 10.8 (H) 11/09/2019   HGB 14.0 11/09/2019   HCT 42.0 11/09/2019   MCV 94.0 11/09/2019   PLT 168 11/09/2019   Lab Results  Component Value Date   NA 140 11/09/2019   K 3.8 11/09/2019   CO2 24 11/09/2019   GLUCOSE 125 (H) 11/09/2019   BUN 11 11/09/2019   CREATININE 0.84 11/09/2019   BILITOT 0.7 11/09/2019   ALKPHOS 45 11/09/2019   AST 18 11/09/2019   ALT 17 11/09/2019   PROT 6.9 11/09/2019   ALBUMIN 4.0 11/09/2019   CALCIUM 9.0 11/09/2019   ANIONGAP 9 11/09/2019   No results found for: CHOL No results found for: HDL No results found for: LDLCALC No results found for: TRIG No results found for: CHOLHDL No results found for: HGBA1C     Assessment & Plan:   Problem List Items Addressed This Visit   None     No orders of the defined types were placed in this encounter.  I, Nance Pear, NP, have reviewed all documentation for this visit. The documentation on 04/05/21 for the exam, diagnosis, procedures, and orders are all accurate and complete.   I, [name], personally preformed the services described in this documentation.  All medical record entries made by the scribe were at my direction and in my presence.  I have reviewed the chart and discharge instructions (if applicable) and agree that the record reflects my personal performance and is accurate and complete. 04/05/21  I,Alexis Bryant,acting as a scribe for Nance Pear, NP.,have documented all relevant documentation  on the behalf of Nance Pear, NP,as directed by  Nance Pear, NP while in the presence of Nance Pear, NP.  [name]

## 2021-04-05 NOTE — Assessment & Plan Note (Signed)
CMA flushed both ears. Cerumen was easily removed.  R TM appears irritated and dull, she has seasonal allergies and is intolerant to OTC allergy products due to corn allergy.  She felt better after removal. No indication for antibiotics.  Advised pt to call if new/worsening pain.

## 2021-04-05 NOTE — Patient Instructions (Signed)
You can purchase an over the counter kit such as ceruminex or Debrox to help prevent wax build up. Please call if you develop worsening pain.

## 2021-09-18 ENCOUNTER — Other Ambulatory Visit: Payer: Self-pay | Admitting: Obstetrics and Gynecology

## 2021-09-18 ENCOUNTER — Encounter: Payer: Self-pay | Admitting: Family Medicine

## 2021-09-18 DIAGNOSIS — Z1231 Encounter for screening mammogram for malignant neoplasm of breast: Secondary | ICD-10-CM

## 2021-10-19 ENCOUNTER — Ambulatory Visit
Admission: RE | Admit: 2021-10-19 | Discharge: 2021-10-19 | Disposition: A | Discharge status: Home / Self Care | Payer: BC Managed Care – PPO | Source: Ambulatory Visit

## 2021-10-19 ENCOUNTER — Other Ambulatory Visit: Payer: Self-pay

## 2021-10-19 DIAGNOSIS — Z1231 Encounter for screening mammogram for malignant neoplasm of breast: Secondary | ICD-10-CM

## 2021-10-25 ENCOUNTER — Other Ambulatory Visit: Payer: Self-pay | Admitting: Obstetrics and Gynecology

## 2021-10-25 DIAGNOSIS — R928 Other abnormal and inconclusive findings on diagnostic imaging of breast: Secondary | ICD-10-CM

## 2021-10-30 ENCOUNTER — Ambulatory Visit
Admission: RE | Admit: 2021-10-30 | Discharge: 2021-10-30 | Disposition: A | Discharge status: Home / Self Care | Payer: BC Managed Care – PPO | Source: Ambulatory Visit | Attending: Obstetrics and Gynecology | Admitting: Obstetrics and Gynecology

## 2021-10-30 ENCOUNTER — Other Ambulatory Visit: Payer: Self-pay

## 2021-10-30 DIAGNOSIS — R928 Other abnormal and inconclusive findings on diagnostic imaging of breast: Secondary | ICD-10-CM

## 2021-11-16 IMAGING — US US BREAST*L* LIMITED INC AXILLA
1 series · 5 of 5 positions shown · non-contrast
Comparison: Previous exam(s).

CLINICAL DATA: Patient recalled from screening for left breast
mass.

EXAM:
DIGITAL DIAGNOSTIC UNILATERAL LEFT MAMMOGRAM WITH TOMOSYNTHESIS AND
CAD; ULTRASOUND LEFT BREAST LIMITED
TECHNIQUE: Left digital diagnostic mammography and breast tomosynthesis was
performed. The images were evaluated with computer-aided detection.;
Targeted ultrasound examination of the left breast was performed.

[Series 1: us breast*left* limited inc axilla · 0.06mm/px · 5 of 5 slices shown]
[im 1/5]
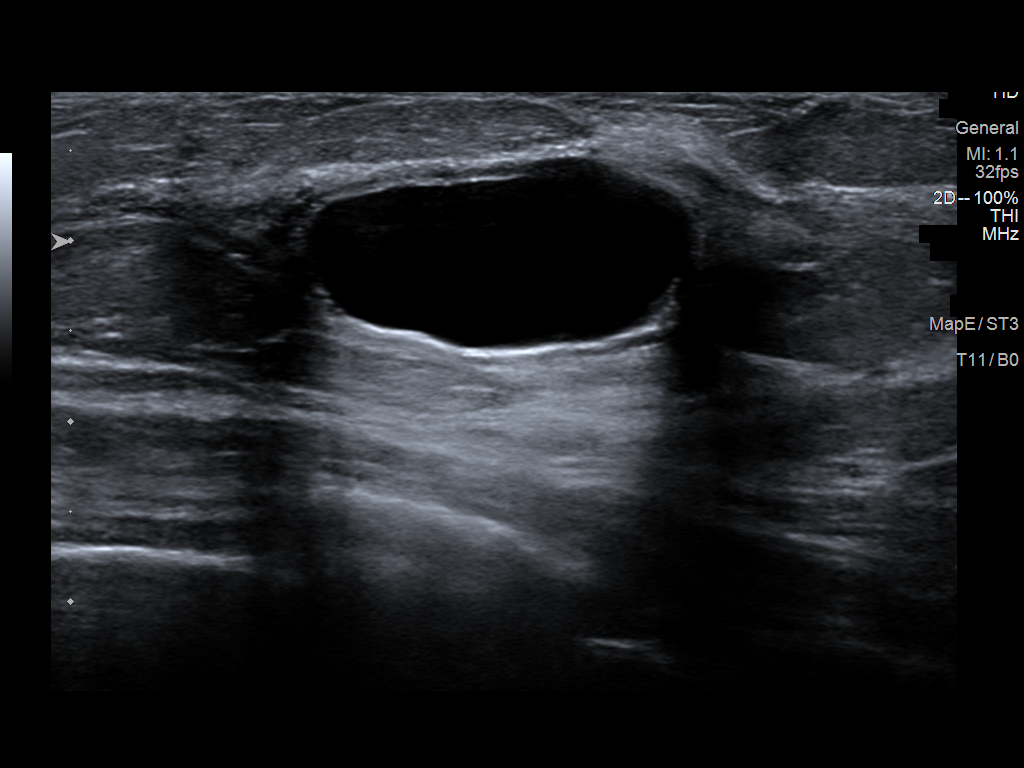
[im 2/5]
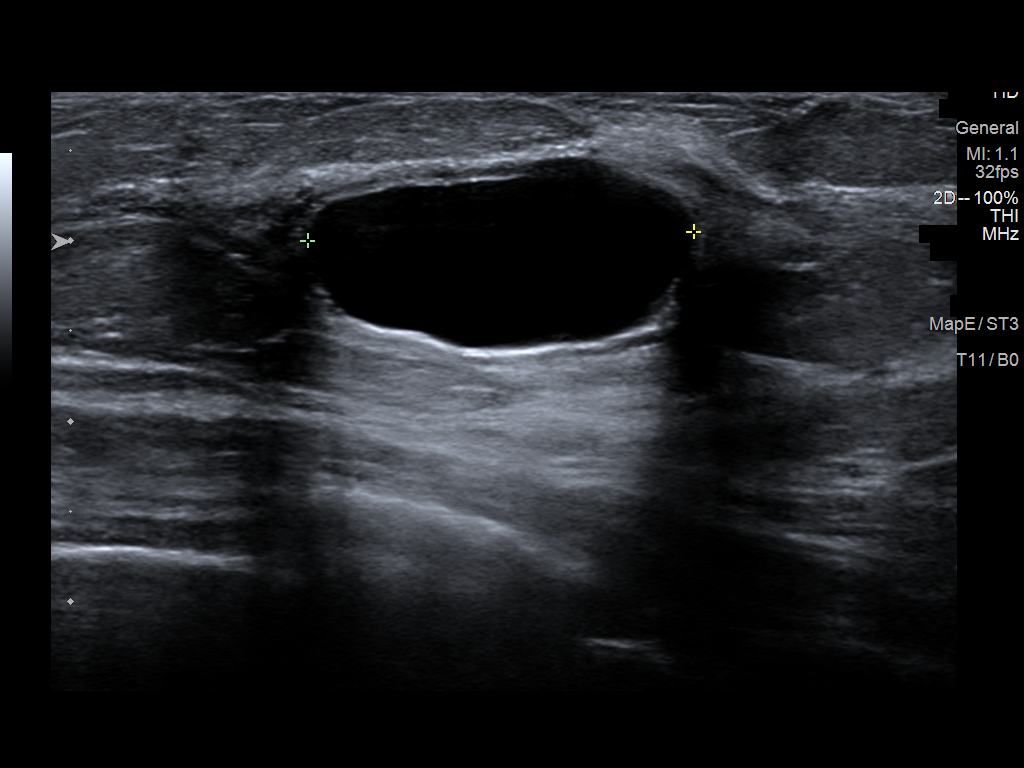
[im 3/5]
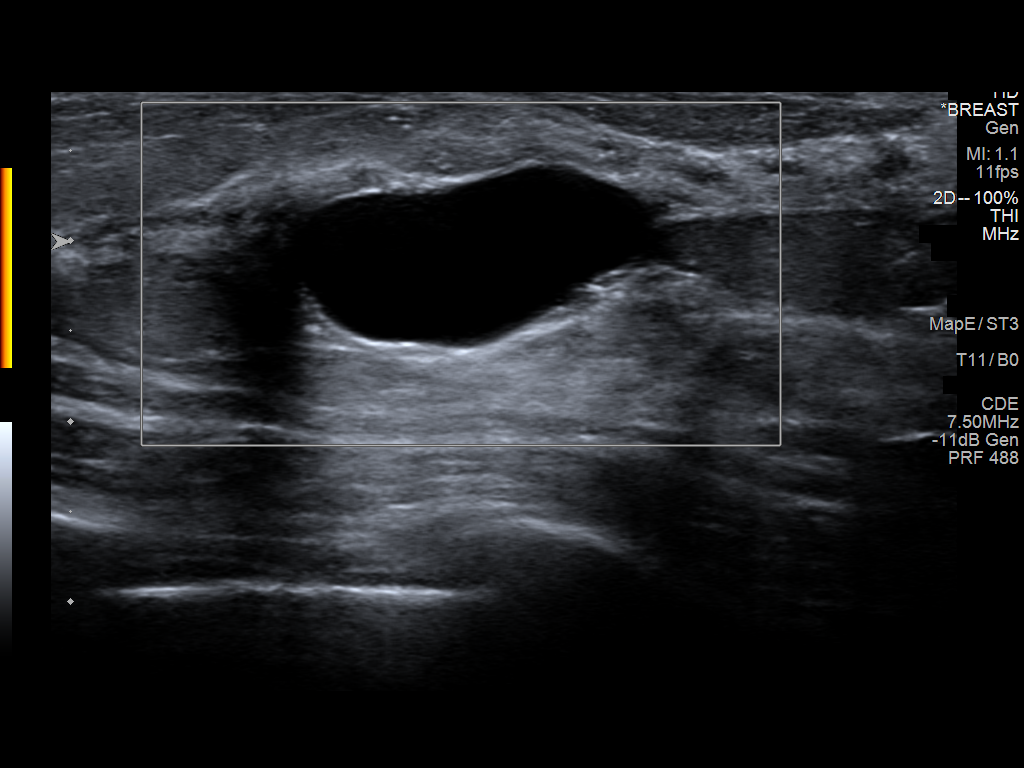
[im 4/5]
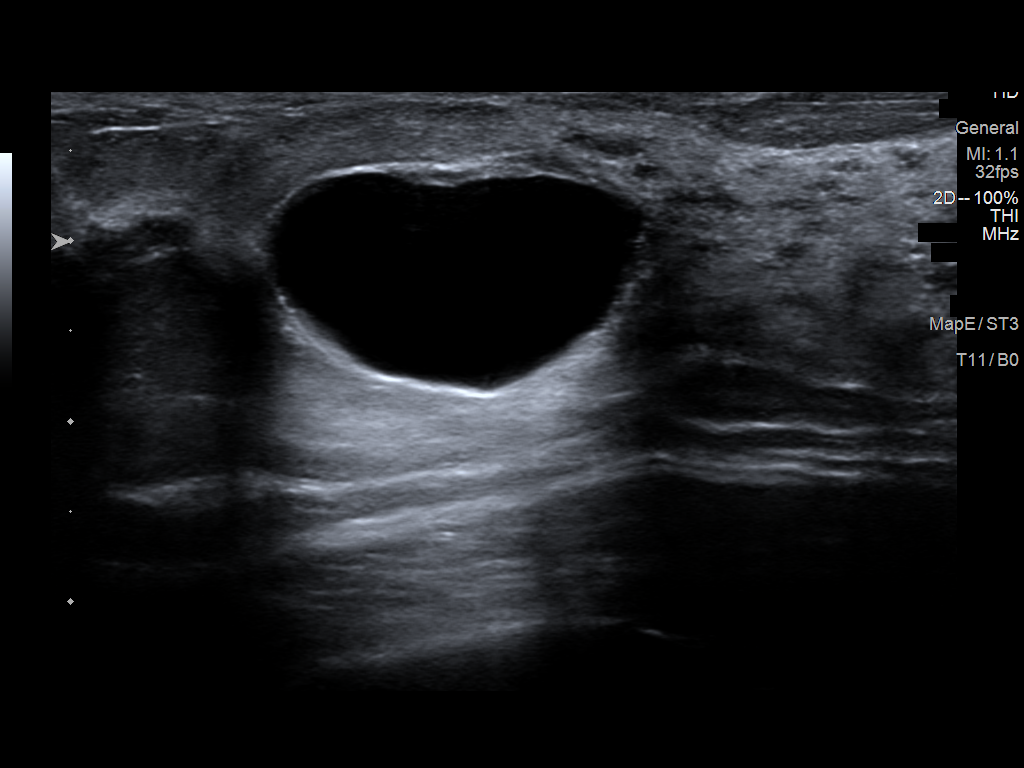
[im 5/5]
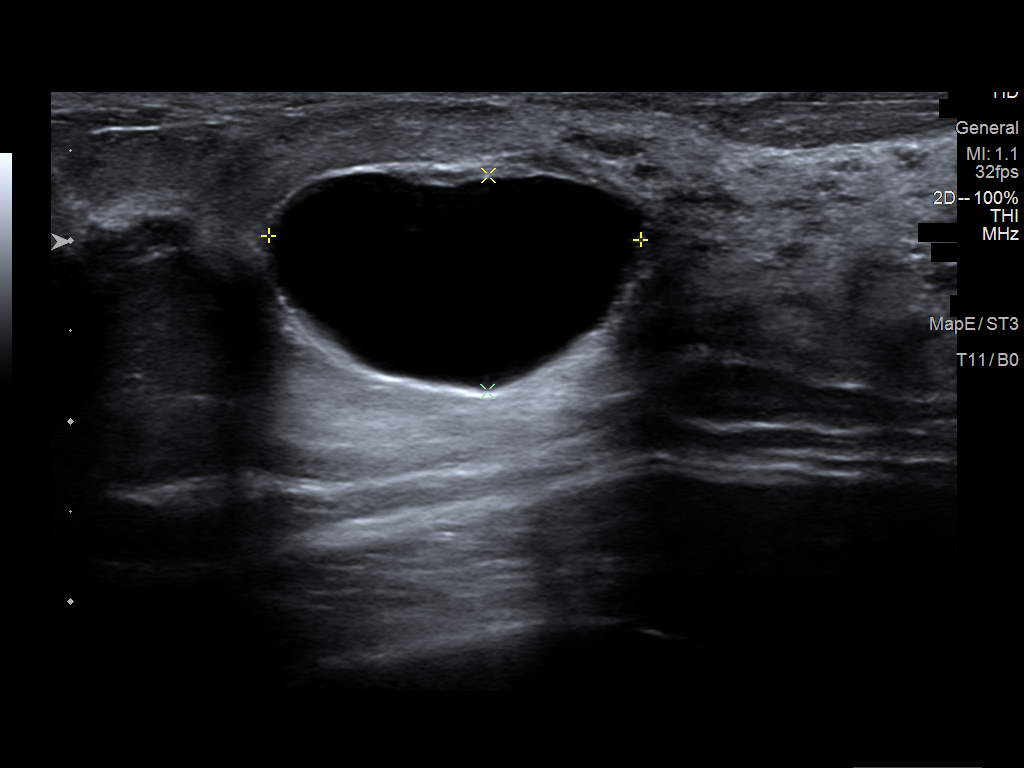

[5 of 5 positions shown; findings below may reference images not displayed]

ACR Breast Density Category c: The breast tissue is heterogeneously
dense, which may obscure small masses.
FINDINGS: Within the posterior slightly medial left breast there is a
persistent oval circumscribed mass.

Targeted ultrasound is performed, showing a 2.1 x 2.1 x 1.2 cm cyst
left breast 12 o'clock position 4 cm from the nipple.
IMPRESSION: Left breast cyst.

RECOMMENDATION:
Screening mammogram in one year.(Code:ZX-Z-QCR)

I have discussed the findings and recommendations with the patient.
If applicable, a reminder letter will be sent to the patient
regarding the next appointment.

BI-RADS CATEGORY  2: Benign.

## 2021-11-16 IMAGING — MG MM DIGITAL DIAGNOSTIC UNILAT*L* W/ TOMO W/ CAD
4 series · 4 of 12 positions shown · non-contrast
Comparison: Previous exam(s).

CLINICAL DATA: Patient recalled from screening for left breast
mass.

EXAM:
DIGITAL DIAGNOSTIC UNILATERAL LEFT MAMMOGRAM WITH TOMOSYNTHESIS AND
CAD; ULTRASOUND LEFT BREAST LIMITED
TECHNIQUE: Left digital diagnostic mammography and breast tomosynthesis was
performed. The images were evaluated with computer-aided detection.;
Targeted ultrasound examination of the left breast was performed.

[L MLO synth-2D]
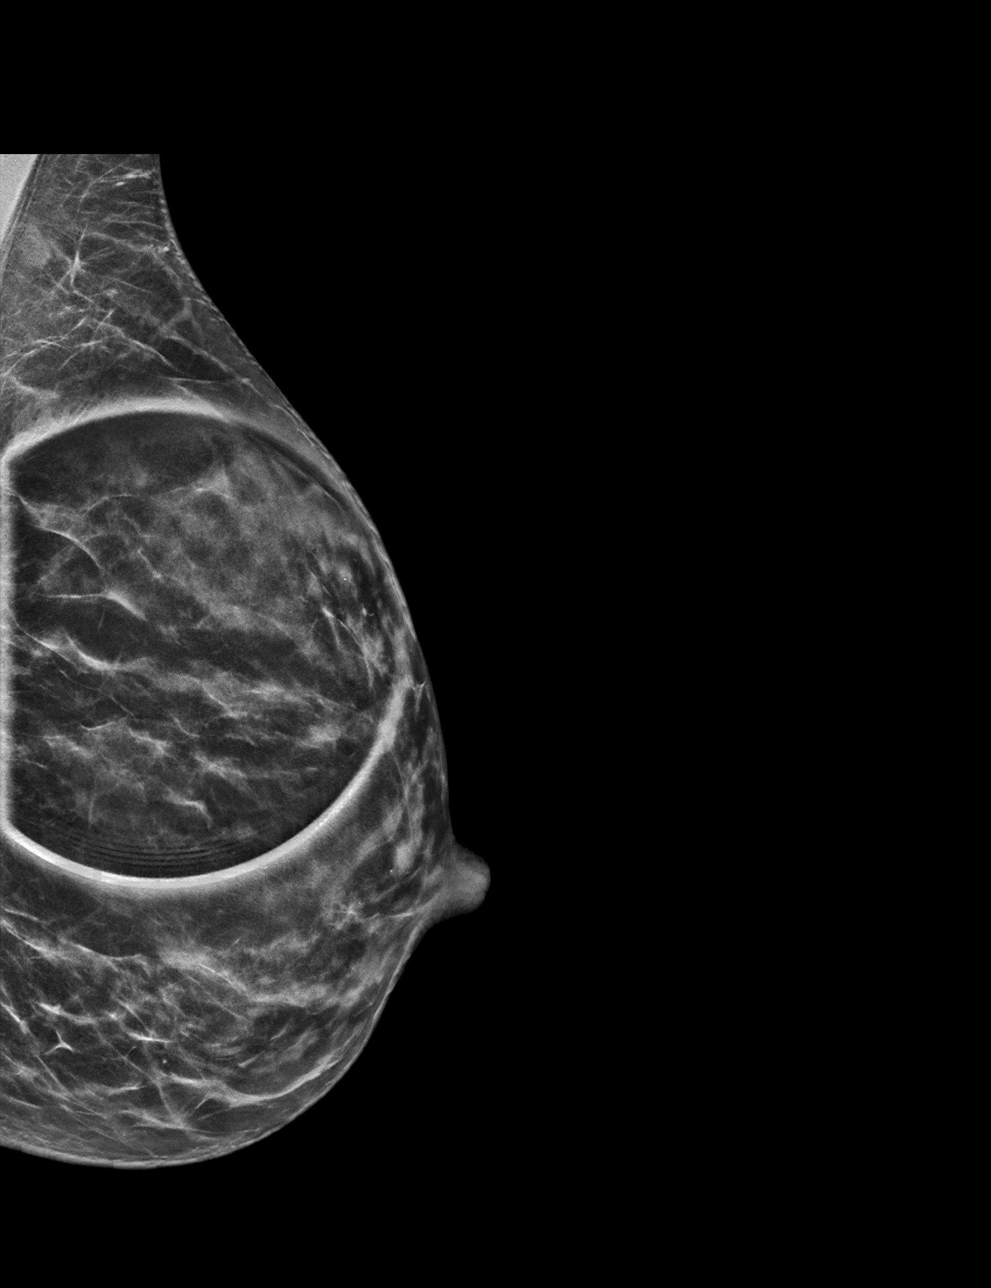

[L CC synth-2D]
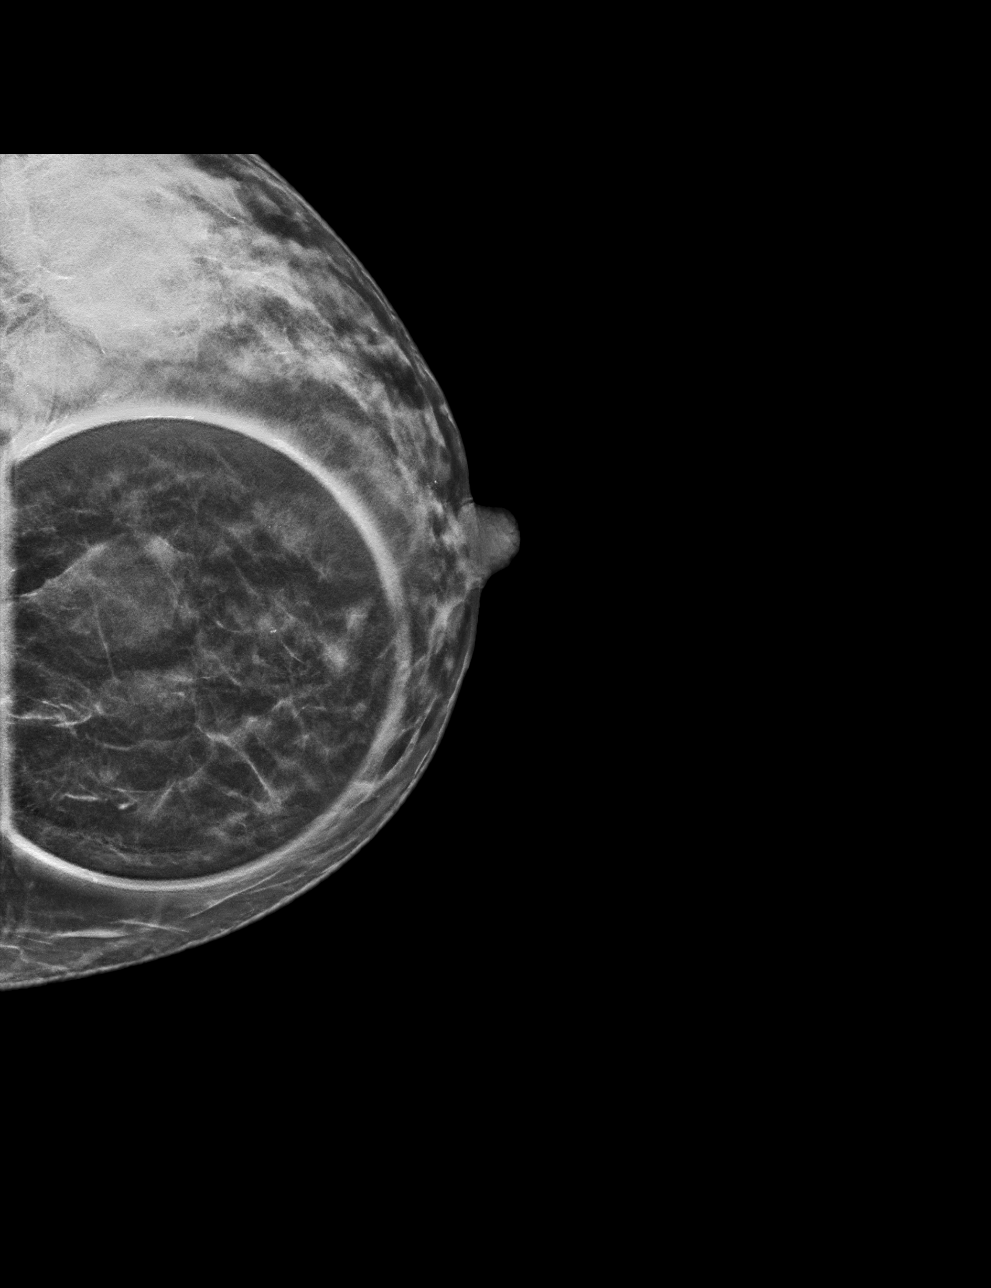

[L MLO tomo · tomo slice 25/48.0]
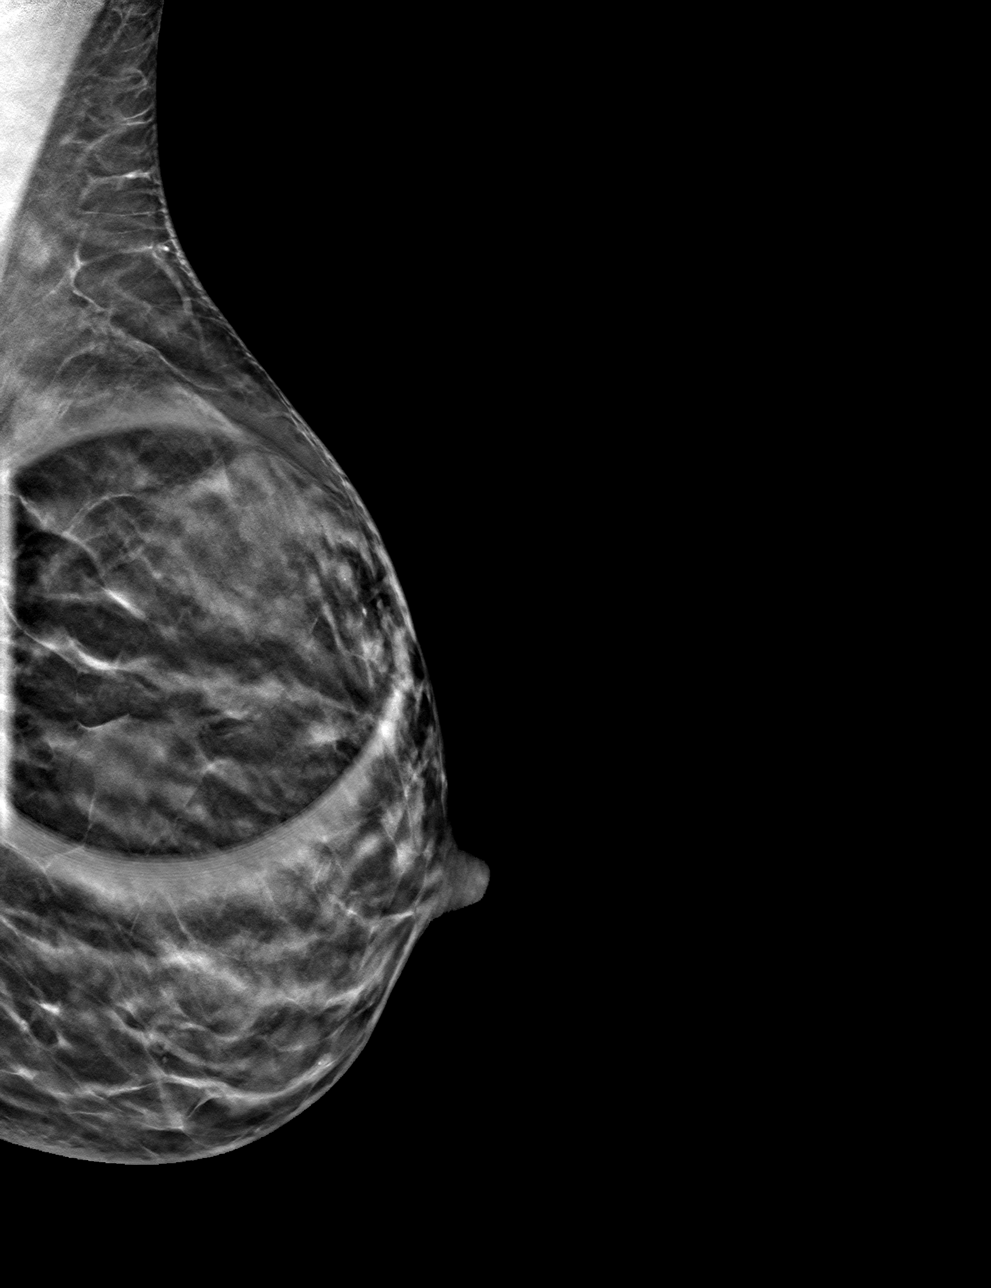

[L CC tomo · tomo slice 22/43.0]
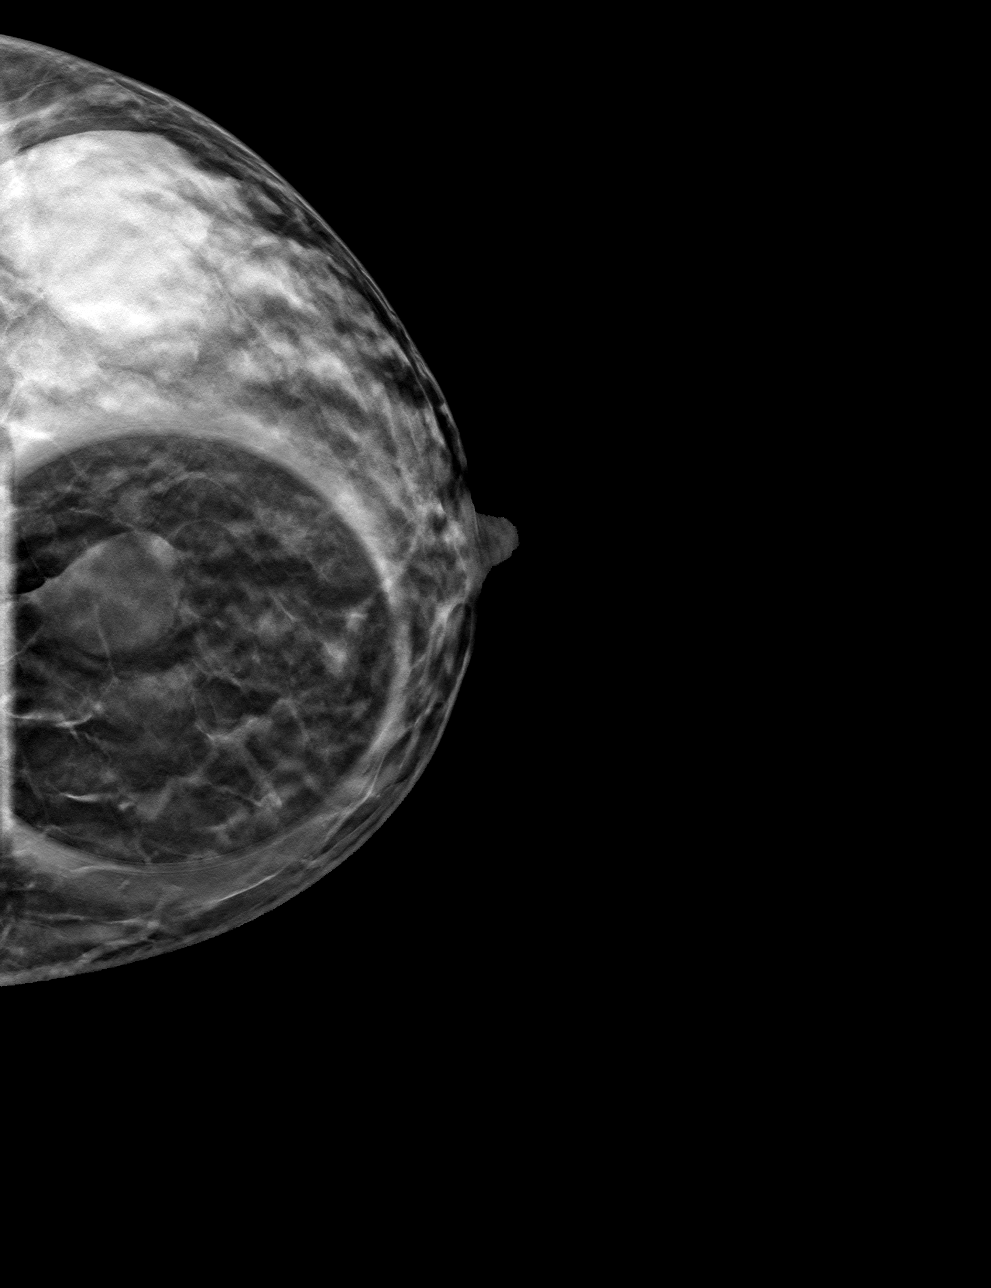

[4 of 12 positions shown; findings below may reference images not displayed]

ACR Breast Density Category c: The breast tissue is heterogeneously
dense, which may obscure small masses.
FINDINGS: Within the posterior slightly medial left breast there is a
persistent oval circumscribed mass.

Targeted ultrasound is performed, showing a 2.1 x 2.1 x 1.2 cm cyst
left breast 12 o'clock position 4 cm from the nipple.
IMPRESSION: Left breast cyst.

RECOMMENDATION:
Screening mammogram in one year.(Code:ZX-[id number])

I have discussed the findings and recommendations with the patient.
If applicable, a reminder letter will be sent to the patient
regarding the next appointment.

BI-RADS CATEGORY  2: Benign.

## 2022-09-11 ENCOUNTER — Encounter: Payer: Self-pay | Admitting: Family Medicine

## 2022-09-17 ENCOUNTER — Other Ambulatory Visit: Payer: Self-pay | Admitting: Obstetrics and Gynecology

## 2022-09-17 DIAGNOSIS — Z1231 Encounter for screening mammogram for malignant neoplasm of breast: Secondary | ICD-10-CM

## 2022-10-10 ENCOUNTER — Telehealth: Payer: Self-pay | Admitting: Obstetrics and Gynecology

## 2022-10-10 ENCOUNTER — Encounter: Payer: Self-pay | Admitting: Obstetrics and Gynecology

## 2022-10-10 ENCOUNTER — Ambulatory Visit (INDEPENDENT_AMBULATORY_CARE_PROVIDER_SITE_OTHER): Payer: BC Managed Care – PPO | Admitting: Obstetrics and Gynecology

## 2022-10-10 VITALS — BP 110/78 | Ht 65.0 in | Wt 142.0 lb

## 2022-10-10 DIAGNOSIS — Z01419 Encounter for gynecological examination (general) (routine) without abnormal findings: Secondary | ICD-10-CM | POA: Diagnosis not present

## 2022-10-10 DIAGNOSIS — N6322 Unspecified lump in the left breast, upper inner quadrant: Secondary | ICD-10-CM

## 2022-10-10 DIAGNOSIS — Z1211 Encounter for screening for malignant neoplasm of colon: Secondary | ICD-10-CM

## 2022-10-10 NOTE — Patient Instructions (Signed)

## 2022-10-10 NOTE — Telephone Encounter (Signed)
Please schedule dx bilateral mammogram and left breast US at the [facility].  My patient has a 3 cm left breast lump at 11:00.  She was diagnosed with a cyst at this location.  She thinks the lump is getting bigger.   She already has a screening mammogram scheduled for the [facility], but this will need to be changed.

## 2022-10-10 NOTE — Telephone Encounter (Signed)
Patient scheduled 10/25/22 @ 8:30am at the breast center. Patient informed.

## 2022-10-10 NOTE — Progress Notes (Signed)
46 y.o. [id number] Married Caucasian female here for annual exam.    Has a lump in her left breast which is enlarging.  It is sore.  She has a known cyst.   On HRT through [employer].  Also on thyroid medication.  Labs done in August.  Had one period in the last year.  PCP:   Garnet Koyanagi, MD  Patient's last menstrual period was 07/04/2022 (exact date).           Sexually active: Yes.    The current method of family planning is condoms most of the time.    Exercising: Yes.    Gym/ health club routine includes treadmill. Smoker:  no  Health Maintenance: Pap:  06/03/2018 neg, neg HR HPV.  History of abnormal Pap:  no MMG: 10/30/2021 BI-RADS CATEGORY  2: Benign. Colonoscopy: 25 years ago BMD:   n/a  Result  n/a TDaP:  04/20/2009, declines today. Gardasil:   no HIV: neg in pregnancy, donated blood 3 years ago. Hep C:  donated blood 3 years ago. Screening Labs:  [facility].  Covid vaccine:  she is planning on this.  Flu vaccine:  declines.   reports that she quit smoking about 28 years ago. Her smoking use included cigarettes. She has never used smokeless tobacco. She reports current alcohol use of about 3.0 standard drinks of alcohol per week. She reports that she does not use drugs.  Past Medical History:  Diagnosis Date   Anemia    Anxiety    GERD (gastroesophageal reflux disease)    Headache(784.0)    Migraine without aura    Seasonal allergies    Short bowel syndrome     Past Surgical History:  Procedure Laterality Date   APPENDECTOMY  11/09/2019   CARPAL TUNNEL RELEASE Right 2002   CESAREAN SECTION  x 2   LAPAROSCOPIC APPENDECTOMY N/A 11/09/2019   Procedure: LAPAROSCOPIC APPENDECTOMY;  Surgeon: Greer Pickerel, MD;  Location: [facility];  Service: General;  Laterality: N/A;   PLANTAR FASCIA SURGERY Right    WISDOM TOOTH EXTRACTION      Current Outpatient Medications  Medication Sig Dispense Refill   acetaminophen (TYLENOL) 500 MG tablet Take 2 tablets (1,000  mg total) by mouth every 8 (eight) hours as needed for mild pain. 30 tablet 0   acetaminophen-codeine (TYLENOL #3) 300-30 MG tablet Take by mouth every 4 (four) hours as needed for moderate pain.     estrogens, conjugated, (PREMARIN) 1.25 MG tablet Take 1.25 mg by mouth daily.     levocetirizine (XYZAL) 5 MG tablet Take 1 tablet (5 mg total) by mouth every evening. 90 tablet 1   NON FORMULARY T3, t4 thyroid medication     polyethylene glycol (MIRALAX / GLYCOLAX) 17 g packet Take 17 g by mouth daily as needed for mild constipation. 14 each 0   progesterone (PROMETRIUM) 200 MG capsule Take 200 mg by mouth daily.     No current facility-administered medications for this visit.    Family History  Problem Relation Age of Onset   Hypertension Mother    Osteoporosis Mother    Atrial fibrillation Mother    Melanoma Father    Hypertension Paternal Grandmother    Hypertension Maternal Grandfather    Alzheimer's disease Maternal Grandmother     Review of Systems  All other systems reviewed and are negative.   Exam:   BP 110/78 (BP Location: Right Arm, Patient Position: Sitting, Cuff Size: Normal)   Ht '5\' 5"'$  (1.651 m)  Wt 142 lb (64.4 kg)   LMP 07/04/2022 (Exact Date)   BMI 23.63 kg/m     General appearance: alert, cooperative and appears stated age Head: normocephalic, without obvious abnormality, atraumatic Neck: no adenopathy, supple, symmetrical, trachea midline and thyroid normal to inspection and palpation Lungs: clear to auscultation bilaterally Breasts: right - normal appearance, no masses or tenderness, No nipple retraction or dimpling, No nipple discharge or bleeding, No axillary adenopathy Right - normal appearance.  3 cm smooth mass at 11:00, nontender.  No nipple retraction or dimpling, No nipple discharge or bleeding, No axillary adenopathy Heart: regular rate and rhythm Abdomen: soft, non-tender; no masses, no organomegaly Extremities: extremities normal, atraumatic, no  cyanosis or edema Skin: skin color, texture, turgor normal. No rashes or lesions Lymph nodes: cervical, supraclavicular, and axillary nodes normal. Neurologic: grossly normal  Pelvic: External genitalia:  no lesions              No abnormal inguinal nodes palpated.              Urethra:  normal appearing urethra with no masses, tenderness or lesions              Bartholins and Skenes: normal                 Vagina: normal appearing vagina with normal color and discharge, no lesions              Cervix: no lesions              Pap taken: no Bimanual Exam:  Uterus:  normal size, contour, position, consistency, mobility, non-tender              Adnexa: no mass, fullness, tenderness              Rectal exam: yes.  Confirms.              Anus:  normal sphincter tone, no lesions  Chaperone was present for exam:  Kimalexis, CMA  Assessment:   Well woman visit with gynecologic exam. Left breast mass. 3 cm at 11:00.  Hx breast cyst.  Significant food allergies. Hx 1.8 cm subserous fibroid. HRT. Colon cancer screening.   Plan: Dx bilateral mammogram and left breast US.  Self breast awareness reviewed. Pap and HR HPV 2024. Guidelines for Calcium, Vitamin D, regular exercise program including cardiovascular and weight bearing exercise. She declines colonoscopy.  Cologuard ordered.  Labs with [facility]. Follow up annually and prn.   After visit summary provided.

## 2022-10-23 DIAGNOSIS — Z1231 Encounter for screening mammogram for malignant neoplasm of breast: Secondary | ICD-10-CM

## 2022-10-25 ENCOUNTER — Ambulatory Visit
Admission: RE | Admit: 2022-10-25 | Discharge: 2022-10-25 | Disposition: A | Discharge status: Home / Self Care | Payer: BC Managed Care – PPO | Source: Ambulatory Visit | Attending: Obstetrics and Gynecology | Admitting: Obstetrics and Gynecology

## 2022-10-25 DIAGNOSIS — N6322 Unspecified lump in the left breast, upper inner quadrant: Secondary | ICD-10-CM

## 2022-11-14 LAB — COLOGUARD: COLOGUARD: POSITIVE — AB

## 2022-11-15 ENCOUNTER — Other Ambulatory Visit: Payer: Self-pay | Admitting: *Deleted

## 2022-11-15 ENCOUNTER — Encounter: Payer: Self-pay | Admitting: Gastroenterology

## 2022-11-15 DIAGNOSIS — R195 Other fecal abnormalities: Secondary | ICD-10-CM

## 2022-12-10 ENCOUNTER — Ambulatory Visit (AMBULATORY_SURGERY_CENTER): Payer: BC Managed Care – PPO

## 2022-12-10 ENCOUNTER — Encounter: Payer: Self-pay | Admitting: Gastroenterology

## 2022-12-10 VITALS — Ht 65.0 in | Wt 145.0 lb

## 2022-12-10 DIAGNOSIS — R195 Other fecal abnormalities: Secondary | ICD-10-CM

## 2022-12-10 DIAGNOSIS — Z1211 Encounter for screening for malignant neoplasm of colon: Secondary | ICD-10-CM

## 2022-12-10 NOTE — Progress Notes (Signed)
Allergies to egg or soy No issues known to pt with past sedation with any surgeries or procedures Patient denies ever being told they had issues or difficulty with intubation  No FH of Malignant Hyperthermia Pt is not on diet pills Pt is not on  home 02  Pt is not on blood thinners  Pt denies issues with constipation  No A fib or A flutter Have any cardiac testing pending--no Pt instructed to use Singlecare.com or GoodRx for a price reduction on prep  Patient's chart reviewed by Osvaldo Angst CNRA prior to previsit and patient appropriate for the [facility].  Previsit completed and red dot placed by patient's name on their procedure day (on provider's schedule).   Allergies to Suprep, Sutab, Clenpiq, Plenvu and Miralax.  Pt states possibly allergic to Golytely. Made Office visit appointment in Gi on 12/14 to discuss patients questions and decide on prep.  Patient prefers to speak with doctor directly. Colonoscopy scheduled for 01/02/23

## 2022-12-13 ENCOUNTER — Ambulatory Visit: Payer: BC Managed Care – PPO | Admitting: Gastroenterology

## 2022-12-13 ENCOUNTER — Telehealth: Payer: Self-pay

## 2022-12-13 ENCOUNTER — Encounter: Payer: Self-pay | Admitting: Gastroenterology

## 2022-12-13 VITALS — BP 112/70 | HR 76 | Ht 64.0 in | Wt 146.5 lb

## 2022-12-13 DIAGNOSIS — R195 Other fecal abnormalities: Secondary | ICD-10-CM | POA: Diagnosis not present

## 2022-12-13 NOTE — Telephone Encounter (Signed)
Left message on machine to call back  

## 2022-12-13 NOTE — Telephone Encounter (Signed)
-----   Message from Loralie Champagne, PA-C sent at 12/13/2022 12:30 PM EST ----- I saw this patient in clinic today to discuss colonoscopy prep.  She cannot take anything with any type of citric preservative or corn base.  We decided on MiraLAX.  I discussed with Dr. Candis Schatz and he agrees that the MiraLAX is the only other thing that he can think to try.  He would like her to do 2 days of clear liquids, however, and have her do a few doses of MiraLAX two evenings before her procedure to try to kick start things since she is not doing the Dulcolax part of the prep.  So her procedure is 1/3.  She will do the regular prep as discussed on 1/2, but should be on clear liquids 1/1 and 1/2 and should do 4 doses of MiraLAX the evening of 1/1.  Thank you,  Jess

## 2022-12-13 NOTE — Patient Instructions (Signed)
You have been scheduled for a colonoscopy. Please follow written instructions given to you at your visit today.  Please pick up your prep supplies at the pharmacy within the next 1-3 days. If you use inhalers (even only as needed), please bring them with you on the day of your procedure.  _______________________________________________________  If you are age 47 or older, your body mass index should be between 23-30. Your Body mass index is 25.15 kg/m. If this is out of the aforementioned range listed, please consider follow up with your Primary Care Provider.  If you are age 55 or younger, your body mass index should be between 19-25. Your Body mass index is 25.15 kg/m. If this is out of the aformentioned range listed, please consider follow up with your Primary Care Provider.   ________________________________________________________  The [facility] providers would like to encourage you to use [facility] to communicate with providers for non-urgent requests or questions.  Due to long hold times on the telephone, sending your provider a message by [facility] may be a faster and more efficient way to get a response.  Please allow 48 business hours for a response.  Please remember that this is for non-urgent requests.  _______________________________________________________

## 2022-12-13 NOTE — Progress Notes (Signed)
12/13/2022 PIPPA HANIF [id number] [birth year]   HISTORY OF PRESENT ILLNESS: This is a 46 year old female who has been referred to our office by Dr. Quincy Simmonds, GYN, for evaluation of abnormal/positive Cologuard.  Patient recalls having a colonoscopy type procedure urgently in the emergency room she says when she was in her early 38s for complaints of rectal bleeding.  She thought that they mentioned polyps, but says that they did not remove anything.  No documentation or procedures reports of such.  Nonetheless, she has never undergone another colonoscopy since then.  She had a positive Cologuard study that was ordered by her GYN so was referred here for colonoscopy.  She has not seen any blood in her stools and has no black stools.  Says she moves her bowels well.  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   Past Medical History:  Diagnosis Date   Anemia    Anxiety    GERD (gastroesophageal reflux disease)    Headache(784.0)    Migraine without aura    Seasonal allergies    Short bowel syndrome    Past Surgical History:  Procedure Laterality Date   APPENDECTOMY  11/09/2019   CARPAL TUNNEL RELEASE Right 2002   CESAREAN SECTION  x 2   LAPAROSCOPIC APPENDECTOMY N/A 11/09/2019   Procedure: LAPAROSCOPIC APPENDECTOMY;  Surgeon: Greer Pickerel, MD;  Location: [facility];  Service: General;  Laterality: N/A;   PLANTAR FASCIA SURGERY Right    WISDOM TOOTH EXTRACTION      reports that she quit smoking about 28 years ago. Her smoking use included cigarettes. She has never used smokeless tobacco. She reports current alcohol use of about 3.0 standard drinks of alcohol per week. She reports that she does not use drugs. family history includes Alzheimer's disease in her maternal grandmother; Atrial fibrillation in her mother; Hypertension in her maternal  grandfather, mother, and paternal grandmother; Melanoma in her father; Osteoporosis in her mother. Allergies  Allergen Reactions   Amoxicillin Hives   Clarithromycin Hives   Other     Food intolerances -- Auto immune responses to foods (Gluten, Dairy, Corn, Nuts, Citrus, etc)   Penicillins Rash    Did it involve swelling of the face/tongue/throat, SOB, or low BP? Yes Did it involve sudden or severe rash/hives, skin peeling, or any reaction on the inside of your mouth or nose? Yes Did you need to seek medical attention at a hospital or doctor's office? Yes When did it last happen?    Pt was 46 years old   If all above answers are "NO", may proceed with cephalosporin use.    Sulfa Antibiotics Nausea Only      Outpatient Encounter Medications as of 12/13/2022  Medication Sig   estradiol (ESTRACE) 1 MG tablet Take 1 mg by mouth daily.   naltrexone (DEPADE) 50 MG tablet Take by mouth daily.   NON FORMULARY T3, t4 thyroid medication   Pregnenolone 50 MG TABS Take by mouth.   progesterone (PROMETRIUM) 200 MG capsule Take 200 mg by mouth daily.   acetaminophen (TYLENOL) 500 MG tablet Take 2 tablets (1,000 mg total) by mouth every 8 (eight) hours as needed for mild pain. (Patient not taking: Reported on 12/10/2022)   acetaminophen-codeine (TYLENOL #3) 300-30 MG tablet Take by mouth every 4 (four) hours as needed for moderate  pain. (Patient not taking: Reported on 12/10/2022)   levocetirizine (XYZAL) 5 MG tablet Take 1 tablet (5 mg total) by mouth every evening. (Patient not taking: Reported on 12/10/2022)   polyethylene glycol (MIRALAX / GLYCOLAX) 17 g packet Take 17 g by mouth daily as needed for mild constipation. (Patient not taking: Reported on 12/10/2022)   [DISCONTINUED] estrogens, conjugated, (PREMARIN) 1.25 MG tablet Take 1.25 mg by mouth daily. (Patient not taking: Reported on 12/10/2022)   No facility-administered encounter medications on file as of 12/13/2022.     REVIEW OF  SYSTEMS  : All other systems reviewed and negative except where noted in the History of Present Illness.   PHYSICAL EXAM: BP 112/70   Pulse 76   Ht '5\' 4"'$  (1.626 m)   Wt 146 lb 8 oz (66.5 kg)   BMI 25.15 kg/m  General: Well developed white female in no acute distress Head: Normocephalic and atraumatic Eyes:  Sclerae anicteric, conjunctiva pink. Ears: Normal auditory acuity Lungs: Clear throughout to auscultation; no W/R/R. Heart: Regular rate and rhythm; no M/R/G. Rectal:  Will be done at the time of colonoscopy. Musculoskeletal: Symmetrical with no gross deformities  Skin: No lesions on visible extremities Extremities: No edema  Neurological: Alert oriented x 4, grossly non-focal Psychological:  Alert and cooperative. Normal mood and affect  ASSESSMENT AND PLAN: *Positive Cologuard: She has never had a colonoscopy in the past.  It sounds like maybe she had an unprepped flexible sigmoidoscopy in her early 42s.  No GI complaints.  She is here today to discuss her colonoscopy prep as she cannot take anything with a corn base or citric acid preservative type product due to severe allergy.  We ultimately decided on MiraLAX prep without the Dulcolax tablets.  She will need to mix it with water instead of Gatorade.  She is already scheduled for procedure 1/3 with Dr. Candis Schatz.  I will discuss with him to confirm and see if he has any other thoughts.  The risks, benefits, and alternatives to colonoscopy were discussed with the patient and she consents to proceed.    CC:  Aundria Rud*

## 2022-12-13 NOTE — Telephone Encounter (Signed)
The pt has been advised of the miralax instructions and clear liquids 2 days prior.  The pt has been advised of the information and verbalized understanding.

## 2022-12-16 NOTE — Progress Notes (Signed)
Agree with the assessment and plan as outlined by Alonza Bogus, PA-C.  Agree with Miralax-based bowel prep.  She may benefit from a 2-day prep or by taking MiraLax for a few days prior to her colonoscopy, given the absence of dulcolax in her prep regimen.  Jennifer Payes E. Candis Schatz, MD  [facility]

## 2022-12-25 ENCOUNTER — Encounter: Payer: Self-pay | Admitting: Gastroenterology

## 2022-12-26 ENCOUNTER — Encounter: Payer: Self-pay | Admitting: Gastroenterology

## 2023-01-02 ENCOUNTER — Encounter: Payer: Self-pay | Admitting: Gastroenterology

## 2023-01-02 ENCOUNTER — Ambulatory Visit (AMBULATORY_SURGERY_CENTER): Payer: BC Managed Care – PPO | Admitting: Gastroenterology

## 2023-01-02 VITALS — BP 120/77 | HR 63 | Temp 97.3°F | Resp 12 | Ht 64.0 in | Wt 146.0 lb

## 2023-01-02 DIAGNOSIS — D123 Benign neoplasm of transverse colon: Secondary | ICD-10-CM

## 2023-01-02 DIAGNOSIS — R195 Other fecal abnormalities: Secondary | ICD-10-CM | POA: Diagnosis not present

## 2023-01-02 DIAGNOSIS — D122 Benign neoplasm of ascending colon: Secondary | ICD-10-CM

## 2023-01-02 DIAGNOSIS — K635 Polyp of colon: Secondary | ICD-10-CM

## 2023-01-02 MED ORDER — SODIUM CHLORIDE 0.9 % IV SOLN: 500.0000 mL | Freq: Once | INTRAVENOUS | Status: DC

## 2023-01-02 NOTE — Op Note (Signed)
[facility] Patient Name: Jody Parrish Procedure Date: 01/02/2023 9:19 AM MRN: [id number] Endoscopist: Nicki Reaper E. Candis Schatz , MD, [id number] Age: 47 Referring MD:  Date of Birth: 30-May-1976 Gender: Female Account #: [id number] Procedure:                Colonoscopy Indications:              Positive Cologuard test Medicines:                Monitored Anesthesia Care Procedure:                Pre-Anesthesia Assessment:                           - Prior to the procedure, a History and Physical                            was performed, and patient medications and                            allergies were reviewed. The patient's tolerance of                            previous anesthesia was also reviewed. The risks                            and benefits of the procedure and the sedation                            options and risks were discussed with the patient.                            All questions were answered, and informed consent                            was obtained. Prior Anticoagulants: The patient has                            taken no anticoagulant or antiplatelet agents. ASA                            Grade Assessment: II - A patient with mild systemic                            disease. After reviewing the risks and benefits,                            the patient was deemed in satisfactory condition to                            undergo the procedure.                           After obtaining informed consent, the colonoscope  was passed under direct vision. Throughout the                            procedure, the patient's blood pressure, pulse, and                            oxygen saturations were monitored continuously. The                            CF [id number] #[id number] was introduced through the anus                            and advanced to the the terminal ileum, with                            identification of the  appendiceal orifice and IC                            valve. The colonoscopy was somewhat difficult due                            to poor endoscopic visualization and significant                            looping. Successful completion of the procedure was                            aided by using manual pressure. Scope In: 9:50:33 AM Scope Out: 10:19:06 AM Scope Withdrawal Time: 0 hours 19 minutes 9 seconds  Total Procedure Duration: 0 hours 28 minutes 33 seconds  Findings:                 The perianal and digital rectal examinations were                            normal. Pertinent negatives include normal                            sphincter tone and no palpable rectal lesions.                           An 8 mm polyp was found in the transverse colon.                            The polyp was flat. The polyp was removed with a                            cold snare. Resection and retrieval were complete.                            Estimated blood loss was minimal.                           A 12 mm polyp was  found in the ascending colon. The                            polyp was flat. The polyp was removed with a cold                            snare. Resection and retrieval were complete.                            Estimated blood loss was minimal.                           The exam was otherwise normal throughout the                            examined colon.                           The terminal ileum appeared normal.                           The retroflexed view of the distal rectum and anal                            verge was normal and showed no anal or rectal                            abnormalities. Complications:            No immediate complications. Estimated Blood Loss:     Estimated blood loss was minimal. Impression:               - One 8 mm polyp in the transverse colon, removed                            with a cold snare. Resected and retrieved.                            - One 12 mm polyp in the ascending colon, removed                            with a cold snare. Resected and retrieved.                           - The examined portion of the ileum was normal.                           - The distal rectum and anal verge are normal on                            retroflexion view. Recommendation:           - Patient has a contact number available for  emergencies. The signs and symptoms of potential                            delayed complications were discussed with the                            patient. Return to normal activities tomorrow.                            Written discharge instructions were provided to the                            patient.                           - Resume previous diet.                           - Continue present medications.                           - Await pathology results.                           - Repeat colonoscopy (date not yet determined) for                            surveillance based on pathology results. Merle Cirelli E. Candis Schatz, MD 01/02/2023 10:26:58 AM This report has been signed electronically.

## 2023-01-02 NOTE — Patient Instructions (Signed)
Resume previous diet and medications. Awaiting pathology results. Repeat Colonoscopy date to be determined based on pathology.  YOU HAD AN ENDOSCOPIC PROCEDURE TODAY AT THE [facility]:   Refer to the procedure report that was given to you for any specific questions about what was found during the examination.  If the procedure report does not answer your questions, please call your gastroenterologist to clarify.  If you requested that your care partner not be given the details of your procedure findings, then the procedure report has been included in a sealed envelope for you to review at your convenience later.  YOU SHOULD EXPECT: Some feelings of bloating in the abdomen. Passage of more gas than usual.  Walking can help get rid of the air that was put into your GI tract during the procedure and reduce the bloating. If you had a lower endoscopy (such as a colonoscopy or flexible sigmoidoscopy) you may notice spotting of blood in your stool or on the toilet paper. If you underwent a bowel prep for your procedure, you may not have a normal bowel movement for a few days.  Please Note:  You might notice some irritation and congestion in your nose or some drainage.  This is from the oxygen used during your procedure.  There is no need for concern and it should clear up in a day or so.  SYMPTOMS TO REPORT IMMEDIATELY:  Following lower endoscopy (colonoscopy or flexible sigmoidoscopy):  Excessive amounts of blood in the stool  Significant tenderness or worsening of abdominal pains  Swelling of the abdomen that is new, acute  Fever of 100F or higher  For urgent or emergent issues, a gastroenterologist can be reached at any hour by calling [phone]. Do not use MyChart messaging for urgent concerns.    DIET:  We do recommend a small meal at first, but then you may proceed to your regular diet.  Drink plenty of fluids but you should avoid alcoholic beverages for 24 hours.  ACTIVITY:   You should plan to take it easy for the rest of today and you should NOT DRIVE or use heavy machinery until tomorrow (because of the sedation medicines used during the test).    FOLLOW UP: Our staff will call the number listed on your records the next business day following your procedure.  We will call around 7:15- 8:00 am to check on you and address any questions or concerns that you may have regarding the information given to you following your procedure. If we do not reach you, we will leave a message.     If any biopsies were taken you will be contacted by phone or by letter within the next 1-3 weeks.  Please call us at [phone] if you have not heard about the biopsies in 3 weeks.    SIGNATURES/CONFIDENTIALITY: You and/or your care partner have signed paperwork which will be entered into your electronic medical record.  These signatures attest to the fact that that the information above on your After Visit Summary has been reviewed and is understood.  Full responsibility of the confidentiality of this discharge information lies with you and/or your care-partner. 

## 2023-01-02 NOTE — Progress Notes (Signed)
History and Physical Interval Note:  01/02/2023 9:26 AM  Jody Parrish  has presented today for endoscopic procedure(s), with the diagnosis of  Encounter Diagnosis  Name Primary?   Positive colorectal cancer screening using Cologuard test Yes  .  The various methods of evaluation and treatment have been discussed with the patient and/or family. After consideration of risks, benefits and other options for treatment, the patient has consented to  the endoscopic procedure(s).   The patient's history has been reviewed, patient examined, no change in status, stable for endoscopic procedure(s).  I have reviewed the patient's chart and labs.  Questions were answered to the patient's satisfaction.     Anaise Sterbenz E. Candis Schatz, MD [facility]

## 2023-01-02 NOTE — Progress Notes (Signed)
Called to room to assist during endoscopic procedure.  Patient ID and intended procedure confirmed with present staff. Received instructions for my participation in the procedure from the performing physician.  

## 2023-01-02 NOTE — Progress Notes (Signed)
VS by DT  Pt's states no medical or surgical changes since previsit or office visit.  

## 2023-01-02 NOTE — Progress Notes (Signed)
Sedate, gd SR, tolerated procedure well, VSS, report to RN 

## 2023-01-03 ENCOUNTER — Telehealth: Payer: Self-pay | Admitting: *Deleted

## 2023-01-03 NOTE — Telephone Encounter (Signed)
Post procedure follow up phone call. No answer at number given.  Left message on voicemail.  

## 2023-01-13 NOTE — Progress Notes (Signed)
Jody Parrish,   The polyps that I removed during your recent procedure were completely benign but were proven to be "pre-cancerous" polyps that MAY have grown into cancers if they had not been removed.  Studies shows that at least 20% of women over age 47 and 30% of men over age 26 have pre-cancerous polyps.  Based on current nationally recognized surveillance guidelines, I recommend that you have a repeat colonoscopy in 3 years.   If you develop any new rectal bleeding, abdominal pain or significant bowel habit changes, please contact me before then.

## 2023-12-03 NOTE — Progress Notes (Signed)
Office Visit Note  Patient: Jody Parrish             Date of Birth: 1976-11-20           MRN: [id number]             PCP: Patton Salles, MD Referring: Janean Sark* Visit Date: 12/17/2023 Occupation: @GUAROCC @  Subjective:  Pain in joints and muscles, rash  History of Present Illness: Jody Parrish is a 47 y.o. female in consultation per request of her PCP.  According the patient her symptoms started about 12 years ago with food sensitivities.  She states she was developed headaches, rash, diarrhea and joint pain and muscle pain.  She states about 8 years ago she started going to [facility] where she had extensive workup and was told that she had leaky gut.  She was also diagnosed with some hormonal deficiency and was started on hormones.  She noticed some improvement in the fatigue and joint pain on hormones.  She states her joint pain has fluctuated over the years.  She was also diagnosed with mold infestation.  She was started on tramadol about 10 months ago.  She also has been taking naltrexone for the last 4 years.  She said about 8 months ago she started experiencing increased joint pain.  She reports joint pain in her shoulders, elbows, wrist, hands which she describes mostly over the [facility] joints, SI joints, hip joints and knee joints.  She has not noticed any joint swelling.  She gives history of plantar fasciitis in the past.  She states she had right plantar fascia release about 25 years ago.  She is having symptoms of left plantar fasciitis.  She denies any history of Achilles tendinitis or uveitis.  She has extensive rash for the last 3 years.  She states the rash started on the scalp and then it moved to her extremities and pubic region.  She has never seen a dermatologist.  She has been taking probiotics and vitamin D which has helped with her hair loss and joint pain.  She denies any history of diarrhea or constipation.  She had colonoscopy because of  positive Cologuard.  There is no family history of psoriasis.  Her mother has rheumatoid arthritis.  She is gravida 4, para 2, abortions 2, there is no history of preeclampsia or DVTs.  She is a Futures trader.  She walks for exercise and also does strength training.    Activities of Daily Living:  Patient reports morning stiffness for 0 minutes.   Patient Denies nocturnal pain.  Difficulty dressing/grooming: Denies Difficulty climbing stairs: Denies Difficulty getting out of chair: Denies Difficulty using hands for taps, buttons, cutlery, and/or writing: Denies  Review of Systems  Constitutional:  Positive for fatigue.  HENT:  Negative for mouth sores, mouth dryness and nose dryness.   Eyes:  Positive for photophobia. Negative for pain and dryness.  Respiratory:  Negative for cough, shortness of breath and wheezing.   Cardiovascular:  Negative for chest pain and palpitations.  Gastrointestinal:  Negative for blood in stool, constipation and diarrhea.  Endocrine: Negative for increased urination.  Genitourinary:  Negative for involuntary urination.  Musculoskeletal:  Positive for joint pain, joint pain, joint swelling, myalgias, muscle weakness, muscle tenderness and myalgias. Negative for gait problem and morning stiffness.  Skin:  Positive for rash, hair loss and sensitivity to sunlight. Negative for color change.  Allergic/Immunologic: Negative for susceptible to infections.  Neurological:  Negative for dizziness and headaches.  Hematological:  Negative for swollen glands.  Psychiatric/Behavioral:  Negative for depressed mood and sleep disturbance. The patient is not nervous/anxious.     PMFS History:  Patient Active Problem List   Diagnosis Date Noted   Positive colorectal cancer screening using Cologuard test 12/13/2022   Ceruminosis, bilateral 04/05/2021   Acute appendicitis 11/09/2019   Fibroids, subserous 06/12/2018   Enlarged uterus 06/03/2018   Chest tightness 01/11/2016    HEADACHE 11/06/2007    Past Medical History:  Diagnosis Date   Anemia    Anxiety    GERD (gastroesophageal reflux disease)    Headache(784.0)    Migraine without aura    Seasonal allergies    Short bowel syndrome     Family History  Problem Relation Age of Onset   Hypertension Mother    Osteoporosis Mother    Atrial fibrillation Mother    Rheum arthritis Mother    Multiple myeloma Mother    Melanoma Father    Healthy Sister    Rheum arthritis Maternal Grandmother    Alzheimer's disease Maternal Grandmother    Hypertension Maternal Grandfather    Hypertension Paternal Grandmother    Healthy Son    Healthy Son    Colon cancer Neg Hx    Colon polyps Neg Hx    Esophageal cancer Neg Hx    Rectal cancer Neg Hx    Stomach cancer Neg Hx    Past Surgical History:  Procedure Laterality Date   CARPAL TUNNEL RELEASE Right 2002   CESAREAN SECTION  x 2   LAPAROSCOPIC APPENDECTOMY N/A 11/09/2019   Procedure: LAPAROSCOPIC APPENDECTOMY;  Surgeon: Gaynelle Adu, MD;  Location: [facility] OR;  Service: General;  Laterality: N/A;   PLANTAR FASCIA SURGERY Right    WISDOM TOOTH EXTRACTION     Social History   Social History Narrative   Not on file   Immunization History  Administered Date(s) Administered   PFIZER(Purple Top)SARS-COV-2 Vaccination 09/16/2020, 10/07/2020   Tdap 12/31/2006, 04/20/2009     Objective: Vital Signs: BP 122/80 (BP Location: Right Arm, Patient Position: Sitting, Cuff Size: Normal)   Pulse 85   Resp 14   Ht 5\' 5"  (1.651 m)   Wt 147 lb 3.2 oz (66.8 kg)   BMI 24.50 kg/m    Physical Exam Vitals and nursing note reviewed.  Constitutional:      Appearance: She is well-developed.  HENT:     Head: Normocephalic and atraumatic.  Eyes:     Conjunctiva/sclera: Conjunctivae normal.  Cardiovascular:     Rate and Rhythm: Normal rate and regular rhythm.     Heart sounds: Normal heart sounds.  Pulmonary:     Effort: Pulmonary effort is normal.     Breath sounds:  Normal breath sounds.  Abdominal:     General: Bowel sounds are normal.     Palpations: Abdomen is soft.  Musculoskeletal:     Cervical back: Normal range of motion.  Lymphadenopathy:     Cervical: No cervical adenopathy.  Skin:    General: Skin is warm and dry.     Capillary Refill: Capillary refill takes less than 2 seconds.     Comments: Psoriasis patches were noted to her bilateral elbows and forearms.  Psoriasis patches were noted over the right shin and in her ear canals.  Neurological:     Mental Status: She is alert and oriented to person, place, and time.  Psychiatric:        Behavior: Behavior  normal.      Musculoskeletal Exam: Cervical, thoracic and lumbar spine were in good range of motion.  She had no tenderness over SI joints today.  Shoulder joints, elbow joints, wrist joints with good range of motion.  She had left lateral epicondylitis.  She has some tenderness on palpation over left elbow.  Wrist joints, MCPs PIPs and DIPs were in good range of motion with no synovitis.  No nail pitting was noted.  Hip joints and knee joints in good range of motion without any warmth swelling or effusion.  There was no tenderness over ankles or MTPs.  CDAI Exam: CDAI Score: -- Patient Global: --; Provider Global: -- Swollen: --; Tender: -- Joint Exam 12/17/2023   No joint exam has been documented for this visit   There is currently no information documented on the homunculus. Go to the Rheumatology activity and complete the homunculus joint exam.  Investigation: No additional findings.  Imaging: No results found.  Recent Labs: Lab Results  Component Value Date   WBC 10.8 (H) 11/09/2019   HGB 14.0 11/09/2019   PLT 168 11/09/2019   NA 140 11/09/2019   K 3.8 11/09/2019   CL 107 11/09/2019   CO2 24 11/09/2019   GLUCOSE 125 (H) 11/09/2019   BUN 11 11/09/2019   CREATININE 0.84 11/09/2019   BILITOT 0.7 11/09/2019   ALKPHOS 45 11/09/2019   AST 18 11/09/2019   ALT 17  11/09/2019   PROT 6.9 11/09/2019   ALBUMIN 4.0 11/09/2019   CALCIUM 9.0 11/09/2019   GFRAA >60 11/09/2019   July 03, 2023 labs obtained by [facility] at [facility] showed CBC 3.8, hemoglobin 13.7, platelets 184, CMP creatinine 0.85, AST 16, ALT 11, TSH normal, vitamin D 34.4        Speciality Comments: No specialty comments available.  Procedures:  No procedures performed Allergies: Amoxicillin, Clarithromycin, Corn-containing products, Other, Penicillins, and Sulfa antibiotics   Assessment / Plan:     Visit Diagnoses: Chronic inflammatory arthritis-patient complains of pain and discomfort in multiple joints over the years.  She states that her joint pain started about 12 years ago.  She related to food allergies.  Patient gives history of intermittent swelling in her knees.  She also had recurrent lateral epicondylitis and right SI joint pain.  She has had surgery for plantar fasciitis and had recent left foot plantar fasciitis.  Based on her symptoms and extensive psoriasis the diagnosis of psoriatic arthritis most likely.  Pain in right elbow - Referred to OT. NCV 04/18/23-normal.  Patient has been experiencing pain and discomfort in her right elbow.  No synovitis was noted on the examination today.  MRI ordered 05/13/23-Moderate exterior tendinosis with partial interstitial tear involving 1/3 tendon thickness.  Left lateral epicondylitis-she had tenderness over left elbow.  She also had left lateral epicondylitis.  Pain in both hands -she complains of discomfort in her bilateral hands.  No synovitis was noted.  She complains of discomfort mostly over the [facility] joints.  Plan: XR Hand 2 View Right, XR Hand 2 View Left.  X-rays of bilateral hands were unremarkable.  Chronic pain of both knees-she complains of pain and discomfort in the bilateral knee joints.  She also complains of intermittent swelling in her knees.  No warmth swelling or effusion was noted.  Pain in both feet -she complains  of pain and discomfort in the bilateral feet.  She had tenderness over left plantar fascia.  No synovitis was noted.  No dactylitis was noted.  Plan: XR Foot 2 Views Right, XR Foot 2 Views Left.  X-rays showed early osteoarthritic changes.  Plantar fasciitis of left foot-tenderness over left plantar fascia noted.  Patient had right plantar fascia surgery in the past.  Chronic SI joint pain -she gives history of intermittent right SI joint pain.  Plan: XR Pelvis 1-2 Views.  X-rays of bilateral SI joints were unremarkable.  Psoriasis-psoriasis lesions were noted in the ear canals, over her elbows and right shin as depicted in the pictures above.  Detail concerning psoriasis was provided.  Topical agents were discussed.  Patient is concerned that she has multiple allergies.  High risk medication use-in anticipation to start her immunosuppressive therapy I will obtain labs today.  Myalgia-she gives history of myalgia for several years.  Patient has been going to [facility].  She has been on itraconazole for possible mold infestation for almost 1 year.  Patient states she will be finishing the treatment soon.  She also takes naltrexone.  Vitamin D deficiency-she had history of vitamin D deficiency.  Last vitamin D was 34.4 on July 03, 2023.  Fibroids, subserous  Positive colorectal cancer screening using Cologuard test  Family history of rheumatoid arthritis-her mother has rheumatoid arthritis.  Orders: Orders Placed This Encounter  Procedures   XR Hand 2 View Right   XR Hand 2 View Left   XR Foot 2 Views Right   XR Foot 2 Views Left   XR Pelvis 1-2 Views   No orders of the defined types were placed in this encounter.   Face-to-face time spent with patient was 60 minutes. Greater than 50% of time was spent in counseling and coordination of care.  Follow-Up Instructions: Return for Polyarthralgia, psoriasis.   Pollyann Savoy, MD  Note - This record has been created using [employer].  Chart creation errors have been sought, but may not always  have been located. Such creation errors do not reflect on  the standard of medical care.

## 2023-12-09 ENCOUNTER — Other Ambulatory Visit: Payer: Self-pay | Admitting: Obstetrics and Gynecology

## 2023-12-09 DIAGNOSIS — Z1231 Encounter for screening mammogram for malignant neoplasm of breast: Secondary | ICD-10-CM

## 2023-12-17 ENCOUNTER — Ambulatory Visit: Payer: BC Managed Care – PPO

## 2023-12-17 ENCOUNTER — Ambulatory Visit: Payer: BC Managed Care – PPO | Attending: Rheumatology | Admitting: Rheumatology

## 2023-12-17 ENCOUNTER — Ambulatory Visit (INDEPENDENT_AMBULATORY_CARE_PROVIDER_SITE_OTHER): Payer: BC Managed Care – PPO

## 2023-12-17 ENCOUNTER — Encounter: Payer: Self-pay | Admitting: Rheumatology

## 2023-12-17 VITALS — BP 122/80 | HR 85 | Resp 14 | Ht 65.0 in | Wt 147.2 lb

## 2023-12-17 DIAGNOSIS — Z79899 Other long term (current) drug therapy: Secondary | ICD-10-CM

## 2023-12-17 DIAGNOSIS — M1990 Unspecified osteoarthritis, unspecified site: Secondary | ICD-10-CM | POA: Diagnosis not present

## 2023-12-17 DIAGNOSIS — M533 Sacrococcygeal disorders, not elsewhere classified: Secondary | ICD-10-CM

## 2023-12-17 DIAGNOSIS — H6123 Impacted cerumen, bilateral: Secondary | ICD-10-CM

## 2023-12-17 DIAGNOSIS — G8929 Other chronic pain: Secondary | ICD-10-CM

## 2023-12-17 DIAGNOSIS — E559 Vitamin D deficiency, unspecified: Secondary | ICD-10-CM

## 2023-12-17 DIAGNOSIS — Z8261 Family history of arthritis: Secondary | ICD-10-CM

## 2023-12-17 DIAGNOSIS — D252 Subserosal leiomyoma of uterus: Secondary | ICD-10-CM

## 2023-12-17 DIAGNOSIS — M79642 Pain in left hand: Secondary | ICD-10-CM | POA: Diagnosis not present

## 2023-12-17 DIAGNOSIS — M791 Myalgia, unspecified site: Secondary | ICD-10-CM

## 2023-12-17 DIAGNOSIS — R195 Other fecal abnormalities: Secondary | ICD-10-CM

## 2023-12-17 DIAGNOSIS — M25561 Pain in right knee: Secondary | ICD-10-CM

## 2023-12-17 DIAGNOSIS — M25562 Pain in left knee: Secondary | ICD-10-CM

## 2023-12-17 DIAGNOSIS — M79671 Pain in right foot: Secondary | ICD-10-CM | POA: Diagnosis not present

## 2023-12-17 DIAGNOSIS — L409 Psoriasis, unspecified: Secondary | ICD-10-CM

## 2023-12-17 DIAGNOSIS — M722 Plantar fascial fibromatosis: Secondary | ICD-10-CM

## 2023-12-17 DIAGNOSIS — M79672 Pain in left foot: Secondary | ICD-10-CM

## 2023-12-17 DIAGNOSIS — M7712 Lateral epicondylitis, left elbow: Secondary | ICD-10-CM

## 2023-12-17 DIAGNOSIS — M79641 Pain in right hand: Secondary | ICD-10-CM

## 2023-12-17 DIAGNOSIS — M25521 Pain in right elbow: Secondary | ICD-10-CM

## 2023-12-19 LAB — PROTEIN ELECTROPHORESIS, SERUM, WITH REFLEX
Albumin ELP: 4.2 g/dL (ref 3.8–4.8)
Alpha 1: 0.3 g/dL (ref 0.2–0.3)
Alpha 2: 0.7 g/dL (ref 0.5–0.9)
Beta 2: 0.3 g/dL (ref 0.2–0.5)
Beta Globulin: 0.4 g/dL (ref 0.4–0.6)
Gamma Globulin: 1 g/dL (ref 0.8–1.7)
Total Protein: 6.9 g/dL (ref 6.1–8.1)

## 2023-12-19 LAB — RHEUMATOID FACTOR: Rheumatoid fact SerPl-aCnc: <10 [IU]/mL (ref <14)

## 2023-12-19 LAB — HEPATITIS B SURFACE ANTIGEN: Hepatitis B Surface Ag: NONREACTIVE

## 2023-12-19 LAB — QUANTIFERON-TB GOLD PLUS
Mitogen-NIL: >10 [IU]/mL
NIL: 0.02 [IU]/mL
QuantiFERON-TB Gold Plus: NEGATIVE
TB1-NIL: 0 [IU]/mL
TB2-NIL: 0.01 [IU]/mL

## 2023-12-19 LAB — IGG, IGA, IGM
IgG (Immunoglobin G), Serum: 1031 mg/dL (ref 600–1640)
IgM, Serum: 132 mg/dL (ref 50–300)
Immunoglobulin A: 200 mg/dL (ref 47–310)

## 2023-12-19 LAB — CYCLIC CITRUL PEPTIDE ANTIBODY, IGG: Cyclic Citrullin Peptide Ab: <16 U

## 2023-12-19 LAB — HEPATITIS B CORE ANTIBODY, IGM: Hep B C IgM: NONREACTIVE

## 2023-12-19 LAB — HEPATITIS C ANTIBODY: Hepatitis C Ab: NONREACTIVE

## 2023-12-19 LAB — SEDIMENTATION RATE: Sed Rate: 2 mm/h (ref 0–20)

## 2023-12-20 ENCOUNTER — Telehealth: Payer: Self-pay | Admitting: Rheumatology

## 2023-12-20 NOTE — Telephone Encounter (Signed)
-----   Message from Elmyra Ricks sent at 12/20/2023 10:26 AM EST ----- Please call patient to schedule earlier appt if anything opens. Thank you.  I called patient, Rheumatoid factor negative, sed rate normal, anti-CCP negative, hepatitis B nonreactive, hepatitis C nonreactive, TB Gold negative, SPEP normal, immunoglobulins normal. Plan to discuss treatment options at the follow-up visit. If there or any earlier appointments please offer her.

## 2023-12-20 NOTE — Telephone Encounter (Signed)
Attempted to contact patient and left message to advise patient to call the office and schedule patient a sooner appointment.

## 2023-12-20 NOTE — Progress Notes (Signed)
Rheumatoid factor negative, sed rate normal, anti-CCP negative, hepatitis B nonreactive, hepatitis C nonreactive, TB Gold negative, SPEP normal, immunoglobulins normal.  Plan to discuss treatment options at the follow-up visit.  If there or any earlier appointments please offer her.

## 2023-12-30 ENCOUNTER — Other Ambulatory Visit: Payer: Self-pay | Admitting: Obstetrics and Gynecology

## 2023-12-30 DIAGNOSIS — Z1231 Encounter for screening mammogram for malignant neoplasm of breast: Secondary | ICD-10-CM

## 2023-12-31 NOTE — Progress Notes (Signed)
 47 y.o. [id number] Married Caucasian female here for annual exam.    Taking HRT for 2 years.  She was taking progesterone for one year prior to this.  [facility] prescribing.  Doing well.   Had bleeding last March and then in April for about 6 days each time.  She had increased the estrogen just prior to the bleeding.  This was increased due to joint pain.   Has dx of psoriasis.  Not sure if she will start Skyrizi. A probiotic is helping her symptoms.   Takes naltrexone for her autoimmune disorder through [facility].  PCP: Antonio Meth, Jamee SAUNDERS, DO   No LMP recorded. (Menstrual status: Perimenopausal).           Sexually active: Yes.    The current method of family planning is condoms most of the time.    Menopausal hormone therapy:  estrace  and progesterone Exercising: Yes.     Pure barre, walking Smoker:  no  OB History  Gravida Para Term Preterm AB Living  4 2 2  2 2   SAB IAB Ectopic Multiple Live Births   2   2    # Outcome Date GA Lbr Len/2nd Weight Sex Type Anes PTL Lv  4 Term 01/2009 [redacted]w[redacted]d  10 lb 8 oz (4.763 kg) M CS-Unspec   LIV  3 Term 06/2007 [redacted]w[redacted]d  8 lb 2 oz (3.685 kg) M CS-Unspec   LIV  2 IAB           1 IAB              HEALTH MAINTENANCE: Last 2 paps:   06/03/2018 neg, neg HR HPV.  History of abnormal Pap or positive HPV:  no Mammogram:  scheduled Thursday, 10/25/22 Breast Density Cat C, BI-RADS CAT 2 benign Colonoscopy:  01/02/23 - polys - due in 2027. Bone Density:  n/a  Result  n/a   Immunization History  Administered Date(s) Administered   PFIZER(Purple Top)SARS-COV-2 Vaccination 09/16/2020, 10/07/2020   Tdap 12/31/2006, 04/20/2009      reports that she quit smoking about 29 years ago. Her smoking use included cigarettes. She started smoking about 39 years ago. She has never been exposed to tobacco smoke. She has never used smokeless tobacco. She reports current alcohol use of about 3.0 standard drinks of alcohol per week. She reports that she does not  use drugs.  Past Medical History:  Diagnosis Date   Anemia    Anxiety    GERD (gastroesophageal reflux disease)    Headache(784.0)    Migraine without aura    Psoriasis    Seasonal allergies    Short bowel syndrome     Past Surgical History:  Procedure Laterality Date   CARPAL TUNNEL RELEASE Right 2002   CESAREAN SECTION  x 2   LAPAROSCOPIC APPENDECTOMY N/A 11/09/2019   Procedure: LAPAROSCOPIC APPENDECTOMY;  Surgeon: Tanda Locus, MD;  Location: [facility] OR;  Service: General;  Laterality: N/A;   PLANTAR FASCIA SURGERY Right    WISDOM TOOTH EXTRACTION      Current Outpatient Medications  Medication Sig Dispense Refill   estradiol  (ESTRACE ) 1 MG tablet Take 1 mg by mouth daily.     naltrexone (DEPADE) 50 MG tablet Take by mouth daily.     NON FORMULARY T3, t4 thyroid  medication     Pregnenolone 50 MG TABS Take by mouth.     Probiotic Product (PROBIOTIC PO) Take by mouth.     progesterone (PROMETRIUM) 200 MG capsule Take 200 mg  by mouth daily.     VITAMIN D PO Take by mouth.     ITRACONAZOLE PO 2 (two) times daily. (Patient not taking: Reported on 01/14/2024)     No current facility-administered medications for this visit.    ALLERGIES: Amoxicillin, Clarithromycin , Corn-containing products, Other, Penicillins, and Sulfa antibiotics  Family History  Problem Relation Age of Onset   Hypertension Mother    Osteoporosis Mother    Atrial fibrillation Mother    Rheum arthritis Mother    Multiple myeloma Mother    Melanoma Father    Healthy Sister    Rheum arthritis Maternal Grandmother    Alzheimer's disease Maternal Grandmother    Hypertension Maternal Grandfather    Hypertension Paternal Grandmother    Healthy Son    Healthy Son    Colon cancer Neg Hx    Colon polyps Neg Hx    Esophageal cancer Neg Hx    Rectal cancer Neg Hx    Stomach cancer Neg Hx     Review of Systems  All other systems reviewed and are negative.   PHYSICAL EXAM:  BP 110/62 (BP Location: Left  Arm, Patient Position: Sitting, Cuff Size: Small)   Pulse 68   Ht 5' 5.25 (1.657 m)   Wt 153 lb (69.4 kg)   SpO2 99%   BMI 25.27 kg/m     General appearance: alert, cooperative and appears stated age Head: normocephalic, without obvious abnormality, atraumatic Neck: no adenopathy, supple, symmetrical, trachea midline and thyroid  normal to inspection and palpation Lungs: clear to auscultation bilaterally Breasts: normal appearance, no masses or tenderness, No nipple retraction or dimpling, No nipple discharge or bleeding, No axillary adenopathy Heart: regular rate and rhythm Abdomen: soft, non-tender; no masses, no organomegaly Extremities: extremities normal, atraumatic, no cyanosis or edema Skin: skin color, texture, turgor normal. Plaques of raised erythematous areas of skin.  Lymph nodes: cervical, supraclavicular, and axillary nodes normal. Neurologic: grossly normal  Pelvic: External genitalia:  no lesions              No abnormal inguinal nodes palpated.              Urethra:  normal appearing urethra with no masses, tenderness or lesions              Bartholins and Skenes: normal                 Vagina: normal appearing vagina with normal color and discharge, no lesions              Cervix: no lesions              Pap taken: Yes.   Bimanual Exam:  Uterus:  normal size, contour, position, consistency, mobility, non-tender              Adnexa: no mass, fullness, tenderness              Rectal exam: Yes.  .  Confirms.              Anus:  normal sphincter tone, no lesions  Chaperone was present for exam:  Damien FALCON, CMA  ASSESSMENT: Well woman visit with gynecologic exam Hx 1.8 cm subserous fibroid. HRT.  PLAN: Mammogram screening discussed. Self breast awareness reviewed. Pap and HRV collected:  Yes.   Guidelines for Calcium, Vitamin D, regular exercise program including cardiovascular and weight bearing exercise. Medication refills:  NA We reviewed potential increased  risks of cardiovascular disease and breast cancer  with prolonged use of HRT. Labs with PCP and [facility]. Follow up:  yearly and prn

## 2023-12-31 NOTE — Telephone Encounter (Signed)
LMOM advising patient we have an earlier appointment time with Dr. Corliss Skains on 01/09/24 at 10:00 am and requested she return our call to let us know if that date/time is good.  We will hold the appointment until 01/03/24.

## 2024-01-03 NOTE — Progress Notes (Addendum)
 Office Visit Note  Patient: Jody Parrish             Date of Birth: 07-18-1976           MRN: [id number]             PCP: Cathlyn JAYSON Nikki Bobie FORBES, MD Referring: Cathlyn JAYSON Nikki Bobie* Visit Date: 01/09/2024 Occupation: @GUAROCC @  Subjective:  Psoriasis, pain in joints and muscles  History of Present Illness: Jody Parrish is a 48 y.o. female with psoriatic arthritis and psoriasis.  She returns today after her initial visit on December 17, 2023.  She continues to have psoriasis on extremities and your canals.  She has ongoing pain and discomfort in her multiple joints.  She gives history of intermittent swelling in her knee joints, bilateral lateral epicondylitis, stiffness in her hands and her feet.  She also has recurrent plantar fasciitis.  She continues to have chronic discomfort in her right SI joint.  Patient has been going to [facility] for myalgias and is currently taking itraconazole which she will be finishing in 2 weeks.    Activities of Daily Living:  Patient reports morning stiffness for 0 minute.   Patient Denies nocturnal pain.  Difficulty dressing/grooming: Denies Difficulty climbing stairs: Denies Difficulty getting out of chair: Denies Difficulty using hands for taps, buttons, cutlery, and/or writing: Denies  Review of Systems  Constitutional:  Negative for fatigue.  HENT:  Negative for mouth sores and mouth dryness.   Eyes:  Negative for dryness.  Respiratory:  Negative for shortness of breath.   Cardiovascular:  Negative for chest pain and palpitations.  Gastrointestinal:  Negative for blood in stool, constipation and diarrhea.  Endocrine: Negative for increased urination.  Genitourinary:  Negative for involuntary urination.  Musculoskeletal:  Negative for joint pain, gait problem, joint pain, joint swelling, myalgias, muscle weakness, morning stiffness, muscle tenderness and myalgias.  Skin:  Positive for rash. Negative for color change, hair  loss and sensitivity to sunlight.  Allergic/Immunologic: Negative for susceptible to infections.  Neurological:  Negative for dizziness and headaches.  Hematological:  Negative for swollen glands.  Psychiatric/Behavioral:  Negative for depressed mood and sleep disturbance. The patient is not nervous/anxious.     PMFS History:  Patient Active Problem List   Diagnosis Date Noted   Positive colorectal cancer screening using Cologuard test 12/13/2022   Ceruminosis, bilateral 04/05/2021   Acute appendicitis 11/09/2019   Fibroids, subserous 06/12/2018   Enlarged uterus 06/03/2018   Chest tightness 01/11/2016   HEADACHE 11/06/2007    Past Medical History:  Diagnosis Date   Anemia    Anxiety    GERD (gastroesophageal reflux disease)    Headache(784.0)    Migraine without aura    Seasonal allergies    Short bowel syndrome     Family History  Problem Relation Age of Onset   Hypertension Mother    Osteoporosis Mother    Atrial fibrillation Mother    Rheum arthritis Mother    Multiple myeloma Mother    Melanoma Father    Healthy Sister    Rheum arthritis Maternal Grandmother    Alzheimer's disease Maternal Grandmother    Hypertension Maternal Grandfather    Hypertension Paternal Grandmother    Healthy Son    Healthy Son    Colon cancer Neg Hx    Colon polyps Neg Hx    Esophageal cancer Neg Hx    Rectal cancer Neg Hx    Stomach cancer Neg Hx  Past Surgical History:  Procedure Laterality Date   CARPAL TUNNEL RELEASE Right 2002   CESAREAN SECTION  x 2   LAPAROSCOPIC APPENDECTOMY N/A 11/09/2019   Procedure: LAPAROSCOPIC APPENDECTOMY;  Surgeon: Tanda Locus, MD;  Location: [facility] OR;  Service: General;  Laterality: N/A;   PLANTAR FASCIA SURGERY Right    WISDOM TOOTH EXTRACTION     Social History   Social History Narrative   Not on file   Immunization History  Administered Date(s) Administered   PFIZER(Purple Top)SARS-COV-2 Vaccination 09/16/2020, 10/07/2020   Tdap  12/31/2006, 04/20/2009     Objective: Vital Signs: BP 106/71 (BP Location: Left Arm, Patient Position: Sitting, Cuff Size: Normal)   Pulse 76   Resp 12   Ht 5' 5 (1.651 m)   Wt 147 lb (66.7 kg)   BMI 24.46 kg/m    Physical Exam Vitals and nursing note reviewed.  Constitutional:      Appearance: She is well-developed.  HENT:     Head: Normocephalic and atraumatic.  Eyes:     Conjunctiva/sclera: Conjunctivae normal.  Cardiovascular:     Rate and Rhythm: Normal rate and regular rhythm.     Heart sounds: Normal heart sounds.  Pulmonary:     Effort: Pulmonary effort is normal.     Breath sounds: Normal breath sounds.  Abdominal:     General: Bowel sounds are normal.     Palpations: Abdomen is soft.  Musculoskeletal:     Cervical back: Normal range of motion.  Lymphadenopathy:     Cervical: No cervical adenopathy.  Skin:    General: Skin is warm and dry.     Capillary Refill: Capillary refill takes less than 2 seconds.  Neurological:     Mental Status: She is alert and oriented to person, place, and time.  Psychiatric:        Behavior: Behavior normal.      Musculoskeletal Exam: She had good range of motion of cervical, thoracic and lumbar spine.  She had mild tenderness over the right SI joint.  Shoulders, elbows, wrist joints with good range of motion.  She had tenderness over bilateral lateral epicondyle region.  She had bilateral DIP thickening.  No synovitis was noted over MCPs PIPs or DIPs.  Hip joints and knee joints were in good range of motion.  There was no tenderness over ankles or MTPs.  There was no plantar fasciitis or Achilles tendinitis.  CDAI Exam: CDAI Score: -- Patient Global: --; Provider Global: -- Swollen: --; Tender: -- Joint Exam 01/09/2024   No joint exam has been documented for this visit   There is currently no information documented on the homunculus. Go to the Rheumatology activity and complete the homunculus joint  exam.  Investigation: No additional findings.  Imaging: XR Hand 2 View Left Result Date: 12/17/2023 CMC, PIP and DIP narrowing was noted.  No MCP, intercarpal radiocarpal joint space narrowing was noted.  No erosive changes were noted. 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  XR Hand 2 View Right Result Date: 12/17/2023 CMC, PIP and DIP narrowing was noted.  No MCP, intercarpal radiocarpal joint space narrowing was noted.  No erosive changes were noted. 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  XR Foot 2 Views Left Result Date: 12/17/2023 First MTP, PIP and DIP narrowing was noted.  No intertarsal, tibiotalar or subtalar joint space narrowing was noted.  Inferior calcaneal spur was noted.  No erosive changes were noted. Impression: These findings  are suggestive of osteoarthritis of the foot.  XR Foot 2 Views Right Result Date: 12/17/2023 First MTP, PIP and DIP narrowing was noted.  No intertarsal, tibiotalar or subtalar joint space narrowing was noted.  Inferior calcaneal spur was noted.  No erosive changes were noted. 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  XR Pelvis 1-2 Views Result Date: 12/17/2023 No SI joint narrowing or sclerosis was noted.  No hip joint narrowing was noted. Impression: Unremarkable x-rays of the SI joints.   Recent Labs: Lab Results  Component Value Date   WBC 10.8 (H) 11/09/2019   HGB 14.0 11/09/2019   PLT 168 11/09/2019   NA 140 11/09/2019   K 3.8 11/09/2019   CL 107 11/09/2019   CO2 24 11/09/2019   GLUCOSE 125 (H) 11/09/2019   BUN 11 11/09/2019   CREATININE 0.84 11/09/2019   BILITOT 0.7 11/09/2019   ALKPHOS 45 11/09/2019   AST 18 11/09/2019   ALT 17 11/09/2019   PROT 6.9 12/17/2023   ALBUMIN 4.0 11/09/2019   CALCIUM 9.0 11/09/2019   GFRAA >60 11/09/2019   QFTBGOLDPLUS NEGATIVE 12/17/2023   December 17, 2023 SPEP normal, TB Gold negative,  hepatitis B-, hepatitis C negative, immunoglobulins normal, ESR 2, RF negative, anti-CCP negative  Speciality Comments: No specialty comments available.  Procedures:  No procedures performed Allergies: Amoxicillin, Clarithromycin , Corn-containing products, Other, Penicillins, and Sulfa antibiotics   Assessment / Plan:     Visit Diagnoses: Psoriatic arthropathy (HCC) - Psoriasis, intermittent swelling, sacroiliitis, lateral epicondylitis, plantar fasciitis: Patient continues to have tenderness over lateral epicondyle region and right SI joint region.  She continues to have discomfort and stiffness in multiple joints.  No synovitis was noted on the examination today.  Different treatment options and their side effects were discussed at length.  I discussed the option of Otezla.  Sedimentation rate was normal, rheumatoid factor negative and anti-CCP negative.  Indications side effects contraindications were discussed.  Patient wants to proceed with Otezla.  A handout was given and consent was taken.  Counseled patient that Otezla is a PDE 4 inhibitor that works to treat psoriasis and the joint pain and tenderness of psoriatic arthritis.  Counseled patient on purpose, proper use, and adverse effects of Otezla.  Reviewed the most common adverse effects of weight loss, depression, nausea/diarrhea/vomiting, headaches, and nasal congestion.  Advised patient to notify office of any serious changes in mood and/or thoughts of suicide.  Provided patient with medication education material and answered all questions.  Patient consented to Otezla.    Patient dose will be Otezla starter titration pack and then 30 mg twice daily.  Prescription pending insurance approval and once approved patient may pick up sample for starter pack from our office.   Addendum: Patient called back and spoke with our pharmacist.  She did not want to take Otezla as it contains cellulose.  She discussed possible option of going on Skyrizi.   Patient says reviewed his Skyrizi prior to this visit.  She wants to proceed with Skyrizi subcu injections.  We will apply for [facility].  We will take consent when she comes for her first injection.  Psoriasis - Psoriasis patches were noted in the ear canals, over her elbows and right shin.  Psoriasis patches were noted today which are unchanged from the last visit.  High risk medication use-Labs obtained on December 17, 2023 SPEP normal, TB Gold negative, hepatitis B-, hepatitis C negative, immunoglobulins normal.  Lab results were discussed with the  patient.  Lateral epicondylitis, left elbow-she continues to have tenderness over the lateral epicondyle region.  Pain in right elbow - MRI ordered 05/13/23-Moderate exterior tendinosis with partial interstitial tear involving 1/3 tendon thickness.  Pain in both hands-she continues to have a stiffness in her hands.  Bilateral DIP thickening with no synovitis was noted.  Chronic pain of both knees - History of intermittent swelling.  No warmth swelling or effusion was noted today.  Pain in both feet - History of pain and discomfort in bilateral feet.  Tenderness over plantar fascia was noted at the last visit.  She did not have much tenderness today.  X-rays obtained at the last visit showed early osteoarthritic changes.  X-ray findings were reviewed with the patient.  Plantar fasciitis of left foot-she is recurrent symptoms.  Chronic SI joint pain - Intermittent right SI joint..  X-rays were unremarkable.  X-ray findings were reviewed with the patient.  Myalgia - Patient has been going to [facility] and has been on itraconazole for possible mold infestation for 1 year.  Patient states she will be finishing the course of itraconazole in 2 weeks.  She is on naltrexone.  Vitamin D deficiency  Fibroids, subserous  Family history of rheumatoid arthritis-mother  Orders: No orders of the defined types were placed in this encounter.  No orders of  the defined types were placed in this encounter.    Follow-Up Instructions: No follow-ups on file.   Maya Nash, MD  Note - This record has been created using Animal nutritionist.  Chart creation errors have been sought, but may not always  have been located. Such creation errors do not reflect on  the standard of medical care.

## 2024-01-07 DIAGNOSIS — Z1231 Encounter for screening mammogram for malignant neoplasm of breast: Secondary | ICD-10-CM

## 2024-01-09 ENCOUNTER — Telehealth: Payer: Self-pay | Admitting: Pharmacist

## 2024-01-09 ENCOUNTER — Encounter: Payer: Self-pay | Admitting: Rheumatology

## 2024-01-09 ENCOUNTER — Ambulatory Visit: Payer: BC Managed Care – PPO | Attending: Rheumatology | Admitting: Rheumatology

## 2024-01-09 VITALS — BP 106/71 | HR 76 | Resp 12 | Ht 65.0 in | Wt 147.0 lb

## 2024-01-09 DIAGNOSIS — M25561 Pain in right knee: Secondary | ICD-10-CM

## 2024-01-09 DIAGNOSIS — M25521 Pain in right elbow: Secondary | ICD-10-CM

## 2024-01-09 DIAGNOSIS — L409 Psoriasis, unspecified: Secondary | ICD-10-CM | POA: Diagnosis not present

## 2024-01-09 DIAGNOSIS — G8929 Other chronic pain: Secondary | ICD-10-CM

## 2024-01-09 DIAGNOSIS — M79671 Pain in right foot: Secondary | ICD-10-CM

## 2024-01-09 DIAGNOSIS — M25562 Pain in left knee: Secondary | ICD-10-CM

## 2024-01-09 DIAGNOSIS — L4050 Arthropathic psoriasis, unspecified: Secondary | ICD-10-CM

## 2024-01-09 DIAGNOSIS — M791 Myalgia, unspecified site: Secondary | ICD-10-CM

## 2024-01-09 DIAGNOSIS — Z8261 Family history of arthritis: Secondary | ICD-10-CM

## 2024-01-09 DIAGNOSIS — M7712 Lateral epicondylitis, left elbow: Secondary | ICD-10-CM | POA: Diagnosis not present

## 2024-01-09 DIAGNOSIS — D252 Subserosal leiomyoma of uterus: Secondary | ICD-10-CM

## 2024-01-09 DIAGNOSIS — Z79899 Other long term (current) drug therapy: Secondary | ICD-10-CM

## 2024-01-09 DIAGNOSIS — M79672 Pain in left foot: Secondary | ICD-10-CM

## 2024-01-09 DIAGNOSIS — M79642 Pain in left hand: Secondary | ICD-10-CM

## 2024-01-09 DIAGNOSIS — M533 Sacrococcygeal disorders, not elsewhere classified: Secondary | ICD-10-CM

## 2024-01-09 DIAGNOSIS — E559 Vitamin D deficiency, unspecified: Secondary | ICD-10-CM

## 2024-01-09 DIAGNOSIS — M722 Plantar fascial fibromatosis: Secondary | ICD-10-CM

## 2024-01-09 DIAGNOSIS — M79641 Pain in right hand: Secondary | ICD-10-CM

## 2024-01-09 NOTE — Telephone Encounter (Signed)
 Patient does not want to take Otezla due to cellulose which she is allergic to. She would like to try for Skyrizi instead. Please start Skyrizi SQ BIV  Dose: 100mg  at Weeks 0, 4, then every 12 weeks  Sherry Pennant, PharmD, MPH, BCPS, CPP Clinical Pharmacist (Rheumatology and Pulmonology)

## 2024-01-09 NOTE — Progress Notes (Signed)
 After review of ingredients in Otezls with patient after OV, patient does not want to take Otezla due to cellulose as ingredient. She is allergic. She would like to try [employer]. Will start benefits investigation. Will obtain consent at new start visit  Otezla tablets contain an active ingredient called apremilast.  The other ingredients are:  lactose monohydrate  microcrystalline cellulose  croscarmellose sodium  magnesium stearate. The film coat contains: Polyvinyl alcohol, titanium dioxide, polyethylene glycol, talc, iron oxide red. In addition iron oxide  yellow (in 20 mg and 30 mg tablets only) and iron oxide black (in 30 mg tablets only)

## 2024-01-09 NOTE — Patient Instructions (Signed)
Apremilast Tablets What is this medication? APREMILAST (a PRE mil ast) treats autoimmune conditions, such as arthritis and psoriasis. It may also be used to treat mouth ulcers in people with a condition that causes blood vessel swelling (Behcet syndrome). It works by decreasing inflammation. This medicine may be used for other purposes; ask your health care provider or pharmacist if you have questions. COMMON BRAND NAME(S): [place] What should I tell my care team before I take this medication? They need to know if you have any of these conditions: Dehydration Depression Kidney disease Suicidal thoughts, plans, or attempt An unusual or allergic reaction to apremilast, other medications, foods, dyes, or preservatives Pregnant or trying to get pregnant Breastfeeding How should I use this medication? Take this medication by mouth with water. Take it as directed on the prescription label at the same time every day. Do not cut, crush, or chew this medication. Swallow the tablets whole. You can take it with or without food. If it upsets your stomach, take it with food. Keep taking it unless your care team tells you to stop. Talk to your care team about the use of this medication in children. While it may be prescribed for children as young as 6 years for selected conditions, precautions do apply. Overdosage: If you think you have taken too much of this medicine contact a poison control center or emergency room at once. NOTE: This medicine is only for you. Do not share this medicine with others. What if I miss a dose? If you miss a dose, take it as soon as you can. If it is almost time for your next dose, take only that dose. Do not take double or extra doses. What may interact with this medication? Certain medications for seizures, such as carbamazepine, phenobarbital, phenytoin Rifampin Other medications may affect the way this medication works. Talk with your care team about all of the medications  you take. They may suggest changes to your treatment plan to lower the risk of side effects and to make sure your medications work as intended. This list may not describe all possible interactions. Give your health care provider a list of all the medicines, herbs, non-prescription drugs, or dietary supplements you use. Also tell them if you smoke, drink alcohol, or use illegal drugs. Some items may interact with your medicine. What should I watch for while using this medication? Visit your care team for regular checks on your progress. Tell your care team if your symptoms do not start to get better or if they get worse. This medication may cause thoughts of suicide or depression. This includes sudden changes in mood, behaviors, or thoughts. These changes can happen at any time but are more common in the beginning of treatment or after a change in dose. Call your care team right away if you experience these thoughts or worsening depression. Check with your care team if you have severe diarrhea, nausea, and vomiting, or if you sweat a lot. The loss of too much body fluid may make it dangerous for you to take this medication. Discuss the medication with your care team if you may be pregnant. There are benefits and risks to taking medications during pregnancy. Your care team can help you find the option that works for you. Talk to your care team before breastfeeding. Changes to your treatment plan may be needed. What side effects may I notice from receiving this medication? Side effects that you should report to your care team as soon as  possible: Allergic reactions--skin rash, itching, hives, swelling of the face, lips, tongue, or throat Thoughts of suicide or self-harm, worsening mood, feelings of depression Side effects that usually do not require medical attention (report these to your care team if they continue or are bothersome): Diarrhea Headache Loss of appetite with weight  loss Nausea Vomiting This list may not describe all possible side effects. Call your doctor for medical advice about side effects. You may report side effects to FDA at 1-800-FDA-1088. Where should I keep my medication? Keep out of the reach of children and pets. Store below 30 degrees C (86 degrees F). Get rid of any unused medication after the expiration date. To get rid of medications that are no longer needed or have expired: Take the medication to a medication take-back program. Check with your pharmacy or law enforcement to find a location. If you cannot return the medication, check the label or package insert to see if the medication should be thrown out in the garbage or flushed down the toilet. If you are not sure, ask your care team. If it is safe to put it in the trash, take the medication out of the container. Mix the medication with cat litter, dirt, coffee grounds, or other unwanted substance. Seal the mixture in a bag or container. Put it in the trash. NOTE: This sheet is a summary. It may not cover all possible information. If you have questions about this medicine, talk to your doctor, pharmacist, or health care provider.  2024 Elsevier/Gold Standard (2023-04-30 00:00:00)

## 2024-01-09 NOTE — Progress Notes (Signed)
 Pharmacy Note  Subjective:  Patient presents today to [facility] for follow up office visit.   Patient was seen by the pharmacist for counseling on Otezla for psoriatic arthritis and plaque psoriasis. She is naive to treatment  Objective: CMP     Component Value Date/Time   NA 140 11/09/2019 1123   K 3.8 11/09/2019 1123   CL 107 11/09/2019 1123   CO2 24 11/09/2019 1123   GLUCOSE 125 (H) 11/09/2019 1123   BUN 11 11/09/2019 1123   CREATININE 0.84 11/09/2019 1123   CALCIUM 9.0 11/09/2019 1123   PROT 6.9 12/17/2023 1005   ALBUMIN 4.0 11/09/2019 1123   AST 18 11/09/2019 1123   ALT 17 11/09/2019 1123   ALKPHOS 45 11/09/2019 1123   BILITOT 0.7 11/09/2019 1123   GFRNONAA >60 11/09/2019 1123   GFRAA >60 11/09/2019 1123    CBC    Component Value Date/Time   WBC 10.8 (H) 11/09/2019 1123   RBC 4.47 11/09/2019 1123   HGB 14.0 11/09/2019 1123   HGB 13.6 10/18/2014 1030   HCT 42.0 11/09/2019 1123   PLT 168 11/09/2019 1123   MCV 94.0 11/09/2019 1123   MCH 31.3 11/09/2019 1123   MCHC 33.3 11/09/2019 1123   RDW 12.6 11/09/2019 1123    Assessment/Plan:  Conventional DMARDs (MTX) would not be effective for her disease activity (recurrent lateral epicondylitis and right SI joint pain )  Counseled patient that Otezla is a PDE 4 inhibitor that works to treat psoriasis and the joint pain and tenderness of psoriatic arthritis.  Counseled patient on purpose, proper use, and adverse effects of Otezla.  Reviewed the most common adverse effects of weight loss, depression, nausea/diarrhea/vomiting, headaches, and nasal congestion.  Advised patient to notify office of any serious changes in mood and/or thoughts of suicide.  Provided patient with medication education material and answered all questions.  Patient consented to Otezla.  Will apply for Otezla through patient's insurance and update when we receive a response.  Patient dose will be Otezla starter titration pack and then 30 mg  twice daily.  Prescription pending insurance approval and once approved patient may pick up sample for starter pack from our office.  Patient verbalized understanding.  Sherry Pennant, PharmD, MPH, BCPS, CPP Clinical Pharmacist (Rheumatology and Pulmonology)

## 2024-01-09 NOTE — Telephone Encounter (Signed)
 Pending OV note from today, please start Otezla BIV  Dose: 30mg  twice daily  Chesley Mires, PharmD, MPH, BCPS, CPP Clinical Pharmacist (Rheumatology and Pulmonology)

## 2024-01-10 NOTE — Telephone Encounter (Signed)
 Submitted a Prior Authorization request to [facility] for SKYRIZI SQ via CoverMyMeds. Will update once we receive a response.  Key: [id number]

## 2024-01-14 ENCOUNTER — Ambulatory Visit (INDEPENDENT_AMBULATORY_CARE_PROVIDER_SITE_OTHER): Payer: BC Managed Care – PPO | Admitting: Obstetrics and Gynecology

## 2024-01-14 ENCOUNTER — Other Ambulatory Visit (HOSPITAL_COMMUNITY)
Admission: RE | Admit: 2024-01-14 | Discharge: 2024-01-14 | Disposition: A | Discharge status: Home / Self Care | Payer: BC Managed Care – PPO | Source: Ambulatory Visit | Attending: Obstetrics and Gynecology | Admitting: Obstetrics and Gynecology

## 2024-01-14 ENCOUNTER — Encounter: Payer: Self-pay | Admitting: Obstetrics and Gynecology

## 2024-01-14 VITALS — BP 110/62 | HR 68 | Ht 65.25 in | Wt 153.0 lb

## 2024-01-14 DIAGNOSIS — Z124 Encounter for screening for malignant neoplasm of cervix: Secondary | ICD-10-CM | POA: Insufficient documentation

## 2024-01-14 DIAGNOSIS — Z01419 Encounter for gynecological examination (general) (routine) without abnormal findings: Secondary | ICD-10-CM

## 2024-01-14 NOTE — Patient Instructions (Signed)

## 2024-01-15 LAB — CYTOLOGY - PAP
Comment: NEGATIVE
Diagnosis: NEGATIVE
High risk HPV: NEGATIVE

## 2024-01-16 ENCOUNTER — Ambulatory Visit
Admission: RE | Admit: 2024-01-16 | Discharge: 2024-01-16 | Disposition: A | Discharge status: Home / Self Care | Payer: BC Managed Care – PPO | Source: Ambulatory Visit

## 2024-01-16 DIAGNOSIS — Z1231 Encounter for screening mammogram for malignant neoplasm of breast: Secondary | ICD-10-CM

## 2024-01-17 ENCOUNTER — Other Ambulatory Visit (HOSPITAL_COMMUNITY): Payer: Self-pay

## 2024-01-17 NOTE — Telephone Encounter (Signed)
Received notification from [facility] regarding a prior authorization for SKYRIZI SQ. Authorization has been APPROVED from 01/10/2024 to 01/09/2025. Approval letter sent to scan center. Starter kit included in authorization  Per test claim, copay for 28 days supply is $250  Patient can fill through [facility]: [phone]   Authorization # [id number]  Enrolled patient into [facility] copay card: ID: [id number] Rx GROUP: [id number] Rx BIN: [id number] Rx PCN: [id number] Issued: 01/17/2024  Patient can be scheduled for Skyrizi new start. Left VM for patient  Chesley Mires, PharmD, MPH, BCPS, CPP Clinical Pharmacist (Rheumatology and Pulmonology)

## 2024-01-18 ENCOUNTER — Encounter: Payer: Self-pay | Admitting: Obstetrics and Gynecology

## 2024-01-20 NOTE — Telephone Encounter (Signed)
ATC #2 patient to schedule Skyrizi new start. Unable to reach, Left VM

## 2024-01-21 ENCOUNTER — Ambulatory Visit: Payer: BC Managed Care – PPO | Admitting: Rheumatology

## 2024-01-21 NOTE — Telephone Encounter (Signed)
Patient sent MyChart message. She wants to hold off on starting Skyrizi due to psoriasis improving. Closing encounter  Chesley Mires, PharmD, MPH, BCPS, CPP Clinical Pharmacist (Rheumatology and Pulmonology)

## 2024-03-24 NOTE — Progress Notes (Deleted)
 Office Visit Note  Patient: Jody Parrish             Date of Birth: [birth year]           MRN: [id number]             PCP: Donato Schultz, DO Referring: Janean Sark* Visit Date: 04/07/2024 Occupation: @GUAROCC @  Subjective:  No chief complaint on file.   History of Present Illness: Jody Parrish is a 48 y.o. female ***     Activities of Daily Living:  Patient reports morning stiffness for *** {minute/hour:19697}.   Patient {ACTIONS;DENIES/REPORTS:21021675::"Denies"} nocturnal pain.  Difficulty dressing/grooming: {ACTIONS;DENIES/REPORTS:21021675::"Denies"} Difficulty climbing stairs: {ACTIONS;DENIES/REPORTS:21021675::"Denies"} Difficulty getting out of chair: {ACTIONS;DENIES/REPORTS:21021675::"Denies"} Difficulty using hands for taps, buttons, cutlery, and/or writing: {ACTIONS;DENIES/REPORTS:21021675::"Denies"}  No Rheumatology ROS completed.   PMFS History:  Patient Active Problem List   Diagnosis Date Noted   Positive colorectal cancer screening using Cologuard test 12/13/2022   Ceruminosis, bilateral 04/05/2021   Acute appendicitis 11/09/2019   Fibroids, subserous 06/12/2018   Enlarged uterus 06/03/2018   Chest tightness 01/11/2016   HEADACHE 11/06/2007    Past Medical History:  Diagnosis Date   Anemia    Anxiety    GERD (gastroesophageal reflux disease)    Headache(784.0)    Migraine without aura    Psoriasis    Seasonal allergies    Short bowel syndrome     Family History  Problem Relation Age of Onset   Hypertension Mother    Osteoporosis Mother    Atrial fibrillation Mother    Rheum arthritis Mother    Multiple myeloma Mother    Melanoma Father    Healthy Sister    Rheum arthritis Maternal Grandmother    Alzheimer's disease Maternal Grandmother    Hypertension Maternal Grandfather    Hypertension Paternal Grandmother    Healthy Son    Healthy Son    Colon cancer Neg Hx    Colon polyps Neg Hx    Esophageal cancer  Neg Hx    Rectal cancer Neg Hx    Stomach cancer Neg Hx    BRCA 1/2 Neg Hx    Breast cancer Neg Hx    Past Surgical History:  Procedure Laterality Date   CARPAL TUNNEL RELEASE Right 2002   CESAREAN SECTION  x 2   LAPAROSCOPIC APPENDECTOMY N/A 11/09/2019   Procedure: LAPAROSCOPIC APPENDECTOMY;  Surgeon: Gaynelle Adu, MD;  Location: [facility] OR;  Service: General;  Laterality: N/A;   PLANTAR FASCIA SURGERY Right    WISDOM TOOTH EXTRACTION     Social History   Social History Narrative   Not on file   Immunization History  Administered Date(s) Administered   PFIZER(Purple Top)SARS-COV-2 Vaccination 09/16/2020, 10/07/2020   Tdap 12/31/2006, 04/20/2009     Objective: Vital Signs: There were no vitals taken for this visit.   Physical Exam   Musculoskeletal Exam: ***  CDAI Exam: CDAI Score: -- Patient Global: --; Provider Global: -- Swollen: --; Tender: -- Joint Exam 04/07/2024   No joint exam has been documented for this visit   There is currently no information documented on the homunculus. Go to the Rheumatology activity and complete the homunculus joint exam.  Investigation: No additional findings.  Imaging: No results found.  Recent Labs: Lab Results  Component Value Date   WBC 10.8 (H) 11/09/2019   HGB 14.0 11/09/2019   PLT 168 11/09/2019   NA 140 11/09/2019   K 3.8 11/09/2019   CL 107 11/09/2019  CO2 24 11/09/2019   GLUCOSE 125 (H) 11/09/2019   BUN 11 11/09/2019   CREATININE 0.84 11/09/2019   BILITOT 0.7 11/09/2019   ALKPHOS 45 11/09/2019   AST 18 11/09/2019   ALT 17 11/09/2019   PROT 6.9 12/17/2023   ALBUMIN 4.0 11/09/2019   CALCIUM 9.0 11/09/2019   GFRAA >60 11/09/2019   QFTBGOLDPLUS NEGATIVE 12/17/2023    Speciality Comments: No specialty comments available.  Procedures:  No procedures performed Allergies: Amoxicillin, Clarithromycin, Corn-containing products, Other, Penicillins, and Sulfa antibiotics   Assessment / Plan:     Visit  Diagnoses: Psoriatic arthropathy (HCC)  Psoriasis  High risk medication use  Lateral epicondylitis, left elbow  Pain in right elbow  Pain in both hands  Chronic pain of both knees  Pain in both feet  Plantar fasciitis of left foot  Chronic SI joint pain  Myalgia  Vitamin D deficiency  Fibroids, subserous  Family history of rheumatoid arthritis-mother  Orders: No orders of the defined types were placed in this encounter.  No orders of the defined types were placed in this encounter.   Face-to-face time spent with patient was *** minutes. Greater than 50% of time was spent in counseling and coordination of care.  Follow-Up Instructions: No follow-ups on file.   Gearldine Bienenstock, PA-C  Note - This record has been created using Dragon software.  Chart creation errors have been sought, but may not always  have been located. Such creation errors do not reflect on  the standard of medical care.

## 2024-04-07 ENCOUNTER — Ambulatory Visit: Payer: BC Managed Care – PPO | Admitting: Physician Assistant

## 2024-04-07 DIAGNOSIS — M79641 Pain in right hand: Secondary | ICD-10-CM

## 2024-04-07 DIAGNOSIS — Z8261 Family history of arthritis: Secondary | ICD-10-CM

## 2024-04-07 DIAGNOSIS — G8929 Other chronic pain: Secondary | ICD-10-CM

## 2024-04-07 DIAGNOSIS — L409 Psoriasis, unspecified: Secondary | ICD-10-CM

## 2024-04-07 DIAGNOSIS — D252 Subserosal leiomyoma of uterus: Secondary | ICD-10-CM

## 2024-04-07 DIAGNOSIS — M722 Plantar fascial fibromatosis: Secondary | ICD-10-CM

## 2024-04-07 DIAGNOSIS — E559 Vitamin D deficiency, unspecified: Secondary | ICD-10-CM

## 2024-04-07 DIAGNOSIS — M7712 Lateral epicondylitis, left elbow: Secondary | ICD-10-CM

## 2024-04-07 DIAGNOSIS — M79671 Pain in right foot: Secondary | ICD-10-CM

## 2024-04-07 DIAGNOSIS — M25521 Pain in right elbow: Secondary | ICD-10-CM

## 2024-04-07 DIAGNOSIS — L4050 Arthropathic psoriasis, unspecified: Secondary | ICD-10-CM

## 2024-04-07 DIAGNOSIS — Z79899 Other long term (current) drug therapy: Secondary | ICD-10-CM

## 2024-04-07 DIAGNOSIS — M791 Myalgia, unspecified site: Secondary | ICD-10-CM

## 2024-12-09 ENCOUNTER — Encounter: Payer: Self-pay | Admitting: Obstetrics and Gynecology

## 2024-12-21 ENCOUNTER — Other Ambulatory Visit: Payer: Self-pay | Admitting: Obstetrics and Gynecology

## 2024-12-21 DIAGNOSIS — Z1231 Encounter for screening mammogram for malignant neoplasm of breast: Secondary | ICD-10-CM

## 2025-01-13 NOTE — Progress Notes (Signed)
 "  49 y.o. [id number] Married Caucasian female here for annual exam.    HRT through [facility].  She does lab work them.    No vaginal bleeding.   Some vaginal discomfort with sex.  Retired.   PCP: Antonio Cyndee Jamee JONELLE, DO   Patient's last menstrual period was 07/04/2022.    - age 1       Sexually active: Yes.    The current method of family planning is condoms.    Menopausal hormone therapy:  n/a Exercising: Yes.    Pure bar and walking  Smoker:  no  OB History  Gravida Para Term Preterm AB Living  4 2 2  2 2   SAB IAB Ectopic Multiple Live Births   2   2    # Outcome Date GA Lbr Len/2nd Weight Sex Type Anes PTL Lv  4 Term 01/2009 [redacted]w[redacted]d  10 lb 8 oz (4.763 kg) M CS-Unspec   LIV  3 Term 06/2007 [redacted]w[redacted]d  8 lb 2 oz (3.685 kg) M CS-Unspec   LIV  2 IAB           1 IAB              HEALTH MAINTENANCE: Last 2 paps:  01/14/24 neg, HR HPV neg, 06/03/18 neg HPV neg  History of abnormal Pap or positive HPV:  no Mammogram:   01/16/24 Breast Density Cat C, BIRADS Cat 1 neg.  Has appt next week.  Colonoscopy:  01/02/23 - polyps - due in 2027.   Bone Density:  n/a  Result  n/a   Immunization History  Administered Date(s) Administered   PFIZER(Purple Top)SARS-COV-2 Vaccination 09/16/2020, 10/07/2020   Tdap 12/31/2006, 04/20/2009      reports that she quit smoking about 30 years ago. Her smoking use included cigarettes. She started smoking about 40 years ago. She has never been exposed to tobacco smoke. She has never used smokeless tobacco. She reports current alcohol use of about 3.0 standard drinks of alcohol per week. She reports that she does not use drugs.  Past Medical History:  Diagnosis Date   Anemia    Anxiety    GERD (gastroesophageal reflux disease)    Headache(784.0)    Migraine without aura    Psoriasis    Seasonal allergies    Short bowel syndrome     Past Surgical History:  Procedure Laterality Date   CARPAL TUNNEL RELEASE Right 2002   CESAREAN SECTION  x 2    LAPAROSCOPIC APPENDECTOMY N/A 11/09/2019   Procedure: LAPAROSCOPIC APPENDECTOMY;  Surgeon: Tanda Locus, MD;  Location: [facility] OR;  Service: General;  Laterality: N/A;   PLANTAR FASCIA SURGERY Right    WISDOM TOOTH EXTRACTION      Current Outpatient Medications  Medication Sig Dispense Refill   estradiol  (ESTRACE ) 1 MG tablet Take 1 mg by mouth daily.     naltrexone (DEPADE) 50 MG tablet Take by mouth daily.     NON FORMULARY T3, t4 thyroid  medication     Pregnenolone 50 MG TABS Take by mouth.     Probiotic Product (PROBIOTIC PO) Take by mouth.     progesterone (PROMETRIUM) 200 MG capsule Take 200 mg by mouth daily.     VITAMIN D PO Take by mouth.     No current facility-administered medications for this visit.    ALLERGIES: Amoxicillin, Clarithromycin , Corn-containing products, Other, Penicillins, and Sulfa antibiotics  Family History  Problem Relation Age of Onset   Hypertension Mother  Osteoporosis Mother    Atrial fibrillation Mother    Rheum arthritis Mother    Multiple myeloma Mother    Melanoma Father    Healthy Sister    Rheum arthritis Maternal Grandmother    Alzheimer's disease Maternal Grandmother    Hypertension Maternal Grandfather    Hypertension Paternal Grandmother    Healthy Son    Healthy Son    Colon cancer Neg Hx    Colon polyps Neg Hx    Esophageal cancer Neg Hx    Rectal cancer Neg Hx    Stomach cancer Neg Hx    BRCA 1/2 Neg Hx    Breast cancer Neg Hx     Review of Systems  All other systems reviewed and are negative.   PHYSICAL EXAM:  BP 120/76 (BP Location: Left Arm, Patient Position: Sitting)   Pulse 80   Ht 5' 5.75 (1.67 m)   Wt 155 lb (70.3 kg)   LMP 07/04/2022   SpO2 99%   BMI 25.21 kg/m     General appearance: alert, cooperative and appears stated age Head: normocephalic, without obvious abnormality, atraumatic Neck: no adenopathy, supple, symmetrical, trachea midline and thyroid  normal to inspection and palpation Lungs: clear  to auscultation bilaterally Breasts: normal appearance, no masses or tenderness, No nipple retraction or dimpling, No nipple discharge or bleeding, No axillary adenopathy Heart: regular rate and rhythm Abdomen: soft, non-tender; no masses, no organomegaly Extremities: extremities normal, atraumatic, no cyanosis or edema Skin: skin color, texture, turgor normal. No rashes or lesions Lymph nodes: cervical, supraclavicular, and axillary nodes normal. Neurologic: grossly normal  Pelvic: External genitalia:  no lesions              No abnormal inguinal nodes palpated.              Urethra:  normal appearing urethra with no masses, tenderness or lesions              Bartholins and Skenes: normal                 Vagina: normal appearing vagina with normal color and discharge, no lesions              Cervix: no lesions              Pap taken: no Bimanual Exam:  Uterus:  normal size, contour, position, consistency, mobility, non-tender              Adnexa: no mass, fullness, tenderness              Rectal exam: yes.  Confirms.              Anus:  normal sphincter tone, no lesions  Chaperone was present for exam:  Kari HERO, CMA  ASSESSMENT: Well woman visit with gynecologic exam. Hx 1.8 cm subserous fibroid. HRT. Vaginal atrophy. Hx allergies. PHQ-2-9: 0  PLAN: Mammogram screening discussed. Self breast awareness reviewed. Pap and HRV collected:  no.  Due in 2030. Guidelines for Calcium, Vitamin D, regular exercise program including cardiovascular and weight bearing exercise. Medication refills:  NA She receives her HRT from [facility].   Discused [facility] and use of HRT which can has potential to increase risk of PE, DVT, MI, stroke and breast cancer.  Benefits of treating vasomotor symptoms and reducing risk of osteoporosis also reviewed. She will consider vaginal estrogen tx versus using a cooking oil.  Cream, tablets and ring discussed and written information provided. Labs with  [employer] [facility].  Follow up:  yearly and prn.            "

## 2025-01-14 ENCOUNTER — Ambulatory Visit: Payer: BC Managed Care – PPO | Admitting: Obstetrics and Gynecology

## 2025-01-14 ENCOUNTER — Encounter: Payer: Self-pay | Admitting: Obstetrics and Gynecology

## 2025-01-14 VITALS — BP 120/76 | HR 80 | Ht 65.75 in | Wt 155.0 lb

## 2025-01-14 DIAGNOSIS — Z01419 Encounter for gynecological examination (general) (routine) without abnormal findings: Secondary | ICD-10-CM

## 2025-01-14 DIAGNOSIS — N952 Postmenopausal atrophic vaginitis: Secondary | ICD-10-CM

## 2025-01-14 DIAGNOSIS — Z1331 Encounter for screening for depression: Secondary | ICD-10-CM | POA: Diagnosis not present

## 2025-01-14 NOTE — Patient Instructions (Addendum)
 Estradiol  Vaginal Ring (Vaginal Symptoms of Menopause) What is this medication? ESTRADIOL  (es tra DYE ole) relieves the symptoms of menopause, such as vaginal irritation, dryness, or pain during sex. It works by increasing levels of the hormone estrogen in the body. It is an estrogen hormone. This medicine may be used for other purposes; ask your health care provider or pharmacist if you have questions. COMMON BRAND NAME(S): Estring  What should I tell my care team before I take this medication? They need to know if you have any of these conditions: Asthma Blood clotting disorder or history of blood clots Cancer, such as breast, cervical, or liver cancer Diabetes Gallbladder disease Having surgery Heart or blood vessel conditions Hereditary angioedema, a genetic condition that causes episodes of severe swelling High blood pressure High cholesterol High levels of calcium in your blood History of heart attack History of stroke Hysterectomy Kidney disease Liver disease Lupus Migraine or other severe headaches Porphyria Seizures Thyroid  disease Tobacco use Unusual vaginal bleeding An unusual or allergic reaction to estrogens, other medications, foods, dyes, or preservatives Pregnant or trying to get pregnant Breastfeeding How should I use this medication? This medication may be inserted by you or your care team. Follow the directions that are included with your prescription. If you are unsure how to insert the ring, contact your care team. The vaginal ring should remain in place for 90 days. After 90 days, replace your old ring and insert a new one. Keep using this medication unless your care team tells you to stop. This medication comes with INSTRUCTIONS FOR USE. Ask your pharmacist for directions on how to use this medication. Read the information carefully. Talk to your pharmacist or care team if you have questions. A patient package insert for the product will be given with each  prescription and refill. Read this sheet carefully each time. The sheet may change frequently. Contact your care team about the use of this medication in children. Special care may be needed. Overdosage: If you think you have taken too much of this medicine contact a poison control center or emergency room at once. NOTE: This medicine is only for you. Do not share this medicine with others. What if I miss a dose? If you miss a dose, use it as soon as you can. If it is almost time for your next dose, use only that dose. Do not use double or extra doses. What may interact with this medication? This medication may affect how other medications work, and other medications may affect the way this medication works. Talk with your care team about all of the medications you take. They may suggest changes to your treatment plan to lower the risk of side effects and to make sure your medications work as intended. This list may not describe all possible interactions. Give your health care provider a list of all the medicines, herbs, non-prescription drugs, or dietary supplements you use. Also tell them if you smoke, drink alcohol, or use illegal drugs. Some items may interact with your medicine. What should I watch for while using this medication? Visit your care team for regular checks on your progress. Tell your care team if your symptoms do not start to get better or if they get worse. Talk to your care team about how often you should have a pelvic exam, breast exam, and a mammogram. Talk to your care team about your risk of cancer. You may be more at risk for certain types of cancer if you take  this medication. Talk to your care team right away if you have vaginal bleeding while on this medication. If you have a uterus, talk to your care team about whether adding a progestin to your hormone therapy is right for you. Taking progestins with estrogen therapy may lower the risk of uterine cancer, but can have other  health risks. Talk to your care team if you use tobacco products. Changes to your treatment plan may be needed. Tobacco increases the risk of getting a blood clot or having a stroke while you are taking this medication. This risk is higher if you are 35 years or older. Tell your care team right away if you have any change in your eyesight. If you are going to need surgery or other procedure, tell your care team that you are using this medication. You may leave this medication in place during sex or if you need to use a medicine for a vaginal infection. If it comes out during the 90-day period, clean it with warm water and put it back in. If you may be pregnant, stop taking this medication right away and contact your care team. What side effects may I notice from receiving this medication? Side effects that you should report to your care team as soon as possible: Allergic reactions or angioedema--skin rash, itching or hives, swelling of the face, eyes, lips, tongue, arms, or legs, trouble swallowing or breathing Blood clot--pain, swelling, or warmth in the leg, shortness of breath, chest pain Breast tissue changes, new lumps, redness, pain, or discharge from the nipple Change in vision Gallbladder problems--severe stomach pain, nausea, vomiting, fever Increase in blood pressure Liver injury--right upper belly pain, loss of appetite, nausea, light-colored stool, dark yellow or brown urine, yellowing skin or eyes, unusual weakness or fatigue Stroke--sudden numbness or weakness of the face, arm, or leg, trouble speaking, confusion, trouble walking, loss of balance or coordination, dizziness, severe headache, change in vision Unusual vaginal discharge, itching, or odor Vaginal bleeding after menopause, pelvic pain Side effects that usually do not require medical attention (report these to your care team if they continue or are bothersome): Bloating Breast pain or tenderness Hair loss Nausea Stomach  pain Swelling of the ankles, hands, or feet Vaginal irritation at application site This list may not describe all possible side effects. Call your doctor for medical advice about side effects. You may report side effects to FDA at 1-800-FDA-1088. Where should I keep my medication? Keep out of the reach of children and pets. Store at room temperature between 15 and 25 degrees C (59 and 77 degrees F). Get rid of any unused medication after the expiration date. To get rid of medications that are no longer needed or have expired: Take the medication to a medication take-back program. Check with your pharmacy or law enforcement to find a location. If you cannot return the medication, ask your pharmacist or care team how to get rid of this medication safely. NOTE: This sheet is a summary. It may not cover all possible information. If you have questions about this medicine, talk to your doctor, pharmacist, or health care provider.  2024 Elsevier/Gold Standard (2023-11-29 00:00:00)  Estradiol  Vaginal insert What is this medication? ESTRADIOL  (es tra DYE ole) relieves the symptoms of menopause, such as vaginal irritation, dryness, or pain during sex. It works by increasing levels of the hormone estrogen in the body. It is an estrogen hormone. This medicine may be used for other purposes; ask your health care provider or  pharmacist if you have questions. COMMON BRAND NAME(S): Imvexxy , Vagifem , Yuvafem  What should I tell my care team before I take this medication? They need to know if you have any of these conditions: Asthma Blood clotting disorder or history of blood clots Cancer, such as breast, cervical, or liver cancer Diabetes Gallbladder disease Having surgery Heart or blood vessel conditions Hereditary angioedema, a genetic condition that causes episodes of severe swelling High blood pressure High cholesterol High levels of calcium in your blood History of heart attack History of  stroke Hysterectomy Kidney disease Liver disease Lupus Migraine or other severe headaches Porphyria Seizures Thyroid  disease Tobacco use Unusual vaginal bleeding An unusual or allergic reaction to estrogens, other medications, foods, dyes, or preservatives Pregnant or trying to get pregnant Breastfeeding How should I use this medication? This medication is for vaginal use only. Do not take by mouth. Follow the directions that are included with your prescription. Wash hands before and after use. Keep using this medication unless your care team tells you to stop. This medication comes with INSTRUCTIONS FOR USE. Ask your pharmacist for directions on how to use this medication. Read the information carefully. Talk to your pharmacist or care team if you have questions. A patient package insert for the product will be given with each prescription and refill. Read this sheet carefully each time. The sheet may change frequently. Contact your care team about the use of this medication in children. Special care may be needed. Overdosage: If you think you have taken too much of this medicine contact a poison control center or emergency room at once. NOTE: This medicine is only for you. Do not share this medicine with others. What if I miss a dose? If you miss a dose, use it as soon as you can. If it is almost time for your next dose, use only that dose. Do not use double or extra doses. What may interact with this medication? This medication may affect how other medications work, and other medications may affect the way this medication works. Talk with your care team about all of the medications you take. They may suggest changes to your treatment plan to lower the risk of side effects and to make sure your medications work as intended. This list may not describe all possible interactions. Give your health care provider a list of all the medicines, herbs, non-prescription drugs, or dietary supplements  you use. Also tell them if you smoke, drink alcohol, or use illegal drugs. Some items may interact with your medicine. What should I watch for while using this medication? Visit your care team for regular checks on your progress. Tell your care team if your symptoms do not start to get better or if they get worse. Talk to your care team about how often you should have a pelvic exam, breast exam, and a mammogram. Talk to your care team about your risk of cancer. You may be more at risk for certain types of cancer if you take this medication. Talk to your care team right away if you have vaginal bleeding while on this medication. If you have a uterus, talk to your care team about whether adding a progestin to your hormone therapy is right for you. Taking progestins with estrogen therapy may lower the risk of uterine cancer, but can have other health risks. This medication can increase the risk of serious blood clots, which can cause a heart attack or stroke. The risk increases if you are older than 50  years of age or use tobacco. Talk to your care team if you use tobacco products. This medication can cause dry eyes or a slight change in the shape of the eye. This can affect how contact lenses fit and feel. If you wear contact lenses, talk to your care team if you have eye discomfort or vision changes. They can help you find a solution, such as a change in contact lens or eye drops for dryness. If you are going to need surgery or other procedure, tell your care team that you are using this medication. If you may be pregnant, stop taking this medication right away and contact your care team. What side effects may I notice from receiving this medication? Side effects that you should report to your care team as soon as possible: Allergic reactions or angioedema--skin rash, itching or hives, swelling of the face, eyes, lips, tongue, arms, or legs, trouble swallowing or breathing Blood clot--pain, swelling, or  warmth in the leg, shortness of breath, chest pain Breast tissue changes, new lumps, redness, pain, or discharge from the nipple Change in vision Gallbladder problems--severe stomach pain, nausea, vomiting, fever Increase in blood pressure Liver injury--right upper belly pain, loss of appetite, nausea, light-colored stool, dark yellow or brown urine, yellowing skin or eyes, unusual weakness or fatigue Stroke--sudden numbness or weakness of the face, arm, or leg, trouble speaking, confusion, trouble walking, loss of balance or coordination, dizziness, severe headache, change in vision Unusual vaginal discharge, itching, or odor Vaginal bleeding after menopause, pelvic pain Side effects that usually do not require medical attention (report these to your care team if they continue or are bothersome): Bloating Breast pain or tenderness Hair loss Nausea Stomach pain Swelling of the ankles, hands, or feet Vaginal irritation at application site This list may not describe all possible side effects. Call your doctor for medical advice about side effects. You may report side effects to FDA at 1-800-FDA-1088. Where should I keep my medication? Keep out of the reach of children and pets. Store at room temperature between 20 and 25 degrees C (68 and 77 degrees F). Get rid of any unused medication after the expiration date. To get rid of medications that are no longer needed or have expired: Take the medication to a medication take-back program. Check with your pharmacy or law enforcement to find a location. If you cannot return the medication, ask your pharmacist or care team how to get rid of this medication safely. NOTE: This sheet is a summary. It may not cover all possible information. If you have questions about this medicine, talk to your doctor, pharmacist, or health care provider.  2025 Elsevier/Gold Standard (2024-01-14 00:00:00)  Estradiol  Vaginal Cream What is this medication? ESTRADIOL   (es tra DYE ole) reduces vaginal irritation, dryness, and pain during sex due to menopause. It is an estrogen hormone. This medicine may be used for other purposes; ask your health care provider or pharmacist if you have questions. COMMON BRAND NAME(S): Estrace  What should I tell my care team before I take this medication? They need to know if you have any of these conditions: Abnormal vaginal bleeding Blood vessel disease or blood clots Breast, cervical, endometrial, ovarian, liver, or uterine cancer Dementia Diabetes Gallbladder disease Heart disease or recent heart attack High blood pressure High cholesterol High levels of calcium in the blood Hysterectomy Kidney disease Liver disease Migraine headaches Protein C/S deficiency Stroke Systemic lupus erythematosus (SLE) Tobacco use An unusual or allergic reaction to estrogens, soy, other  medications, foods, dyes, or preservatives Pregnant or trying to get pregnant Breast-feeding How should I use this medication? This medication is for use in the vagina only. Do not take by mouth. Follow the directions on the prescription label. Read package directions carefully before using. Use the special applicator supplied with the cream. Wash hands before and after use. Fill the applicator with the prescribed amount of cream. Lie on your back, part and bend your knees. Insert the applicator into the vagina and push the plunger to expel the cream into the vagina. Wash the applicator with warm soapy water and rinse well. Use exactly as directed for the complete length of time prescribed. Do not stop using except on the advice of your care team. A patient package insert for the product will be given with each prescription and refill. Read this sheet carefully each time. The sheet may change frequently. Talk to your care team about the use of this medication in children. This medication is not approved for use in children. Overdosage: If you think you  have taken too much of this medicine contact a poison control center or emergency room at once. NOTE: This medicine is only for you. Do not share this medicine with others. What if I miss a dose? If you miss a dose, use it as soon as you can. If it is almost time for your next dose, use only that dose. Do not use double or extra doses. What may interact with this medication? Do not take this medication with any of the following: Aromatase inhibitors like aminoglutethimide, anastrozole, exemestane, letrozole, testolactone This medication may also interact with the following: Barbiturates used for inducing sleep or treating seizures Carbamazepine Grapefruit juice Medications for fungal infections like ketoconazole and itraconazole Raloxifene Rifabutin Rifampin Rifapentine Ritonavir Some antibiotics used to treat infections St. John's Wort Tamoxifen Warfarin This list may not describe all possible interactions. Give your health care provider a list of all the medicines, herbs, non-prescription drugs, or dietary supplements you use. Also tell them if you smoke, drink alcohol, or use illegal drugs. Some items may interact with your medicine. What should I watch for while using this medication? Visit your care team for regular checks on your progress. You will need a regular breast and pelvic exam. You should also discuss the need for regular mammograms with your care team, and follow their guidelines. This medication can make your body retain fluid, making your fingers, hands, or ankles swell. Your blood pressure can go up. Contact your care team if you feel you are retaining fluid. If you have any reason to think you are pregnant, stop taking this medication at once and contact your care team. Smoking tobacco increases the risk of getting a blood clot or having a stroke while you are taking this medication, especially if you are older than 35 years. If you wear contact lenses and notice visual  changes, or if the lenses begin to feel uncomfortable, consult your eye care specialist. If you are going to have elective surgery, you may need to stop taking this medication beforehand. Consult your care team for advice prior to scheduling the surgery. What side effects may I notice from receiving this medication? Side effects that you should report to your care team as soon as possible: Allergic reactions--skin rash, itching, hives, swelling of the face, lips, tongue, or throat Blood clot--pain, swelling, or warmth in the leg, shortness of breath, chest pain Breast tissue changes, new lumps, redness, pain, or discharge from  the nipple Gallbladder problems--severe stomach pain, nausea, vomiting, fever Increase in blood pressure Liver injury--right upper belly pain, loss of appetite, nausea, light-colored stool, dark yellow or brown urine, yellowing skin or eyes, unusual weakness or fatigue Stroke--sudden numbness or weakness of the face, arm, or leg, trouble speaking, confusion, trouble walking, loss of balance or coordination, dizziness, severe headache, change in vision Unusual vaginal discharge, itching, or odor Vaginal bleeding after menopause, pelvic pain Side effects that usually do not require medical attention (report to your care team if they continue or are bothersome): Bloating Breast pain or tenderness Nausea Vaginal irritation at application site Vomiting This list may not describe all possible side effects. Call your doctor for medical advice about side effects. You may report side effects to FDA at 1-800-FDA-1088. Where should I keep my medication? Keep out of the reach of children and pets. Store at room temperature between 15 and 30 degrees C (59 and 86 degrees F). Protect from temperatures above 40 degrees C (104 degrees C). Do not freeze. Throw away any unused medication after the expiration date. NOTE: This sheet is a summary. It may not cover all possible information. If  you have questions about this medicine, talk to your doctor, pharmacist, or health care provider.  2024 Elsevier/Gold Standard (2021-08-25 00:00:00)  Calcium in Foods Calcium is a mineral in the body. Of all the minerals in your body, you have the most calcium. Most of the body's calcium supply is stored in bones and teeth. Calcium helps many parts of the body work, including: Blood and blood vessels. Nerves. Hormones. Muscles. Bones and teeth. When your calcium stores are low, you may be at risk for low bone mass, bone loss, and broken bones. When you get enough calcium, it helps to support strong bones and teeth throughout your life. Calcium is especially important for: Children during growth spurts. Females during adolescence. Females who are pregnant or breastfeeding. Females after their menstrual cycle stops (postmenopausal). Females whose menstrual cycle has stopped because of an eating disorder or regular intense exercise. People who can't eat or digest dairy products. People who eat a vegan diet. Recommended daily amounts of calcium: Females (ages 27 to 50): 1,000 mg per day. Females (ages 79 and older): 1,200 mg per day. Males (ages 63 to 74): 1,000 mg per day. Males (ages 18 and older): 1,200 mg per day. Females (ages 75 to 73): 1,300 mg per day. Males (ages 37 to 15): 1,300 mg per day. General information Eat foods that are high in calcium. Try to get most of your calcium from food. Some people may benefit from taking calcium supplements. Check with your health care provider or an expert in healthy eating called a dietitian before starting any calcium supplements. Calcium supplements may interact with certain medicines. Too much calcium may cause other health problems, such as trouble pooping and kidney stones. For the body to absorb calcium, it needs vitamin D. Sources of vitamin D include: Skin exposure to direct sunlight. Foods, such as egg yolks, liver, mushrooms,  saltwater fish, and fortified milk. Vitamin D supplements. Check with your provider or dietitian before starting any vitamin D supplements. The amount of calcium that is absorbed in the body varies with type of food. Talk to a dietitian about what foods are best for you, especially if you are eat a vegan diet or don't eat dairy. What foods are high in calcium?  Foods that are high in calcium contain more than 100 milligrams per serving. Fruits  Fortified orange juice or other fruit juice, 300 mg per 8 oz (237 mL) serving. Vegetables Collard greens, 260 mg per 1 cup (130 g) serving, cooked. Kale, 180 mg per 1 cup (118 g) serving, cooked. Bok choy, 180 mg per 1 cup (170 g) serving, cooked Grains Fortified frozen waffles, 200 mg in 2 waffles. Oatmeal, 180 mg in 1 cup (234 g) serving, cooked. Fortified white bread, 175 mg per slice. Meats and other proteins Sardines, canned with bones, 350 mg per 3.75 oz (92 g) serving. Salmon, canned with bones, 168 mg per 3 oz (85 g) serving. Canned shrimp, 125 mg per 3 oz (85 g) serving. Baked beans, 120 mg per 1 cup (266 g) serving. Tofu, firm, made with calcium sulfate, 861 mg per  cup (126 g) serving. Dairy Yogurt, plain, low-fat, 448 mg per 1 cup (245 g) serving Nonfat milk, 300 mg per 1 cup (245 g) serving. American cheese, 145 mg per 1 oz (21 g) serving or 1 slice. Cheddar cheese, 200 mg per 1 oz (28 g) serving or 1 slice. Cottage cheese 2%, 125 mg per  cup (113 g) serving. Fortified soy, rice, or almond milk, 300 mg per 1 cup (237 mL) serving. Mozzarella, part skim, 210 mg per 1 oz (21 g) serving. The items listed above may not be a complete list of foods high in calcium. Actual amounts of calcium may be different depending on processing. Contact a dietitian for more information. What foods are lower in calcium? Foods that are lower in calcium contain 50 mg or less per serving. Fruits Apple, 1 medium, about 6 mg. Banana, 1 medium, about 12  mg. Vegetables Lettuce, 19 mg per 1 cup (35 g) serving. Tomato, 1 small, about 11 mg. Grains Rice, white, 8 mg per  cup (79 g) serving. Boiled potatoes, 14 mg per 1 cup (160 g) serving. White bread, 6 mg per slice. Meats and other proteins Egg, 24 mg per 1 egg (50 g). Red meat, 7 mg per 4 oz (80 g) serving. Chicken, 17 mg per 4 oz (113 g) serving. Fish, cod, or trout, 20 mg per 4 oz (140 g) serving. Dairy Cream cheese, regular, 14 mg per 1 Tbsp (15 g) serving. Brie cheese, 50 mg per 1 oz (32 g) serving. The items listed above may not be a complete list of foods lower in calcium. Actual amounts of calcium may be different depending on processing. Contact a dietitian for more information. This information is not intended to replace advice given to you by your health care provider. Make sure you discuss any questions you have with your health care provider. Document Revised: 09/14/2023 Document Reviewed: 09/14/2023 [employer] Patient Education  2024 [employer]  EXERCISE AND DIET:  We recommended that you start or continue a regular exercise program for good health. Regular exercise means any activity that makes your heart beat faster and makes you sweat.  We recommend exercising at least 30 minutes per day at least 3 days a week, preferably 4 or 5.  We also recommend a diet low in fat and sugar.  Inactivity, poor dietary choices and obesity can cause diabetes, heart attack, stroke, and kidney damage, among others.    ALCOHOL AND SMOKING:  Women should limit their alcohol intake to no more than 7 drinks/beers/glasses of wine (combined, not each!) per week. Moderation of alcohol intake to this level decreases your risk of breast cancer and liver damage. And of course, no recreational drugs are part of a  healthy lifestyle.  And absolutely no smoking or even second hand smoke. Most people know smoking can cause heart and lung diseases, but did you know it also contributes to weakening of your  bones? Aging of your skin?  Yellowing of your teeth and nails?  CALCIUM AND VITAMIN D:  Adequate intake of calcium and Vitamin D are recommended.  The recommendations for exact amounts of these supplements seem to change often, but generally speaking 600 mg of calcium (either carbonate or citrate) and 800 units of Vitamin D per day seems prudent. Certain women may benefit from higher intake of Vitamin D.  If you are among these women, your doctor will have told you during your visit.    PAP SMEARS:  Pap smears, to check for cervical cancer or precancers,  have traditionally been done yearly, although recent scientific advances have shown that most women can have pap smears less often.  However, every woman still should have a physical exam from her gynecologist every year. It will include a breast check, inspection of the vulva and vagina to check for abnormal growths or skin changes, a visual exam of the cervix, and then an exam to evaluate the size and shape of the uterus and ovaries.  And after 50 years of age, a rectal exam is indicated to check for rectal cancers. We will also provide age appropriate advice regarding health maintenance, like when you should have certain vaccines, screening for sexually transmitted diseases, bone density testing, colonoscopy, mammograms, etc.   MAMMOGRAMS:  All women over 97 years old should have a yearly mammogram. Many facilities now offer a 3D mammogram, which may cost around $50 extra out of pocket. If possible,  we recommend you accept the option to have the 3D mammogram performed.  It both reduces the number of women who will be called back for extra views which then turn out to be normal, and it is better than the routine mammogram at detecting truly abnormal areas.    COLONOSCOPY:  Colonoscopy to screen for colon cancer is recommended for all women at age 40.  We know, you hate the idea of the prep.  We agree, BUT, having colon cancer and not knowing it is  worse!!  Colon cancer so often starts as a polyp that can be seen and removed at colonscopy, which can quite literally save your life!  And if your first colonoscopy is normal and you have no family history of colon cancer, most women don't have to have it again for 10 years.  Once every ten years, you can do something that may end up saving your life, right?  We will be happy to help you get it scheduled when you are ready.  Be sure to check your insurance coverage so you understand how much it will cost.  It may be covered as a preventative service at no cost, but you should check your particular policy.

## 2025-01-19 ENCOUNTER — Ambulatory Visit: Admission: RE | Admit: 2025-01-19 | Discharge: 2025-01-19 | Disposition: A | Source: Ambulatory Visit

## 2025-01-19 DIAGNOSIS — Z1231 Encounter for screening mammogram for malignant neoplasm of breast: Secondary | ICD-10-CM

## 2025-02-01 ENCOUNTER — Ambulatory Visit: Payer: Self-pay | Admitting: Obstetrics and Gynecology

## 2026-01-20 ENCOUNTER — Ambulatory Visit: Admitting: Obstetrics and Gynecology
